# Patient Record
Sex: Female | Born: 1980 | Race: Black or African American | Hispanic: No | Marital: Married | Smoking: Former smoker
Health system: Southern US, Community
[De-identification: ages and names within clinical notes are randomized; demographics above are authoritative.]

## PROBLEM LIST (undated history)

## (undated) DIAGNOSIS — F419 Anxiety disorder, unspecified: Secondary | ICD-10-CM

## (undated) DIAGNOSIS — E559 Vitamin D deficiency, unspecified: Secondary | ICD-10-CM

## (undated) DIAGNOSIS — D649 Anemia, unspecified: Secondary | ICD-10-CM

## (undated) DIAGNOSIS — I10 Essential (primary) hypertension: Secondary | ICD-10-CM

## (undated) DIAGNOSIS — F32A Depression, unspecified: Secondary | ICD-10-CM

---
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
## 2003-01-29 ENCOUNTER — Emergency Department (HOSPITAL_COMMUNITY): Admission: AD | Admit: 2003-01-29 | Discharge: 2003-01-29 | Payer: Self-pay | Admitting: Family Medicine

## 2003-02-04 ENCOUNTER — Emergency Department (HOSPITAL_COMMUNITY): Admission: AD | Admit: 2003-02-04 | Discharge: 2003-02-04 | Payer: Self-pay | Admitting: Family Medicine

## 2003-04-20 ENCOUNTER — Emergency Department (HOSPITAL_COMMUNITY): Admission: AD | Admit: 2003-04-20 | Discharge: 2003-04-20 | Payer: Self-pay | Admitting: Family Medicine

## 2004-05-25 ENCOUNTER — Emergency Department (HOSPITAL_COMMUNITY): Admission: EM | Admit: 2004-05-25 | Discharge: 2004-05-26 | Payer: Self-pay | Admitting: Emergency Medicine

## 2005-06-02 ENCOUNTER — Emergency Department (HOSPITAL_COMMUNITY): Admission: EM | Admit: 2005-06-02 | Discharge: 2005-06-03 | Payer: Self-pay | Admitting: Emergency Medicine

## 2005-12-25 ENCOUNTER — Emergency Department (HOSPITAL_COMMUNITY): Admission: EM | Admit: 2005-12-25 | Discharge: 2005-12-25 | Payer: Self-pay | Admitting: Emergency Medicine

## 2006-11-09 ENCOUNTER — Emergency Department (HOSPITAL_COMMUNITY): Admission: EM | Admit: 2006-11-09 | Discharge: 2006-11-09 | Payer: Self-pay | Admitting: Emergency Medicine

## 2006-11-15 ENCOUNTER — Emergency Department (HOSPITAL_COMMUNITY): Admission: EM | Admit: 2006-11-15 | Discharge: 2006-11-15 | Payer: Self-pay | Admitting: Emergency Medicine

## 2007-02-22 ENCOUNTER — Emergency Department (HOSPITAL_COMMUNITY): Admission: EM | Admit: 2007-02-22 | Discharge: 2007-02-22 | Payer: Self-pay | Admitting: Emergency Medicine

## 2007-07-13 ENCOUNTER — Emergency Department (HOSPITAL_COMMUNITY): Admission: EM | Admit: 2007-07-13 | Discharge: 2007-07-13 | Payer: Self-pay | Admitting: Emergency Medicine

## 2007-09-15 ENCOUNTER — Emergency Department (HOSPITAL_COMMUNITY): Admission: EM | Admit: 2007-09-15 | Discharge: 2007-09-15 | Payer: Self-pay | Admitting: *Deleted

## 2007-09-29 ENCOUNTER — Emergency Department (HOSPITAL_COMMUNITY): Admission: EM | Admit: 2007-09-29 | Discharge: 2007-09-29 | Payer: Self-pay | Admitting: Emergency Medicine

## 2007-10-04 ENCOUNTER — Inpatient Hospital Stay (HOSPITAL_COMMUNITY): Admission: AD | Admit: 2007-10-04 | Discharge: 2007-10-04 | Payer: Self-pay | Admitting: Family Medicine

## 2007-12-20 ENCOUNTER — Inpatient Hospital Stay (HOSPITAL_COMMUNITY): Admission: AD | Admit: 2007-12-20 | Discharge: 2007-12-20 | Payer: Self-pay | Admitting: Obstetrics & Gynecology

## 2008-09-07 ENCOUNTER — Inpatient Hospital Stay (HOSPITAL_COMMUNITY): Admission: AD | Admit: 2008-09-07 | Discharge: 2008-09-07 | Payer: Self-pay | Admitting: Obstetrics and Gynecology

## 2008-10-07 ENCOUNTER — Ambulatory Visit: Payer: Self-pay | Admitting: Certified Nurse Midwife

## 2008-11-18 ENCOUNTER — Ambulatory Visit: Payer: Self-pay | Admitting: Family Medicine

## 2008-11-18 ENCOUNTER — Inpatient Hospital Stay (HOSPITAL_COMMUNITY): Admission: AD | Admit: 2008-11-18 | Discharge: 2008-11-18 | Payer: Self-pay | Admitting: Obstetrics and Gynecology

## 2008-12-03 ENCOUNTER — Encounter: Payer: Self-pay | Admitting: Certified Nurse Midwife

## 2008-12-05 ENCOUNTER — Emergency Department (HOSPITAL_COMMUNITY): Admission: EM | Admit: 2008-12-05 | Discharge: 2008-12-05 | Payer: Self-pay | Admitting: Emergency Medicine

## 2008-12-05 ENCOUNTER — Inpatient Hospital Stay (HOSPITAL_COMMUNITY): Admission: AD | Admit: 2008-12-05 | Discharge: 2008-12-06 | Payer: Self-pay | Admitting: Psychiatry

## 2008-12-05 ENCOUNTER — Ambulatory Visit: Payer: Self-pay | Admitting: Psychiatry

## 2008-12-16 ENCOUNTER — Encounter: Payer: Self-pay | Admitting: Certified Nurse Midwife

## 2009-01-15 ENCOUNTER — Encounter: Payer: Self-pay | Admitting: Certified Nurse Midwife

## 2009-01-16 ENCOUNTER — Observation Stay: Payer: Self-pay

## 2009-01-17 ENCOUNTER — Encounter: Payer: Self-pay | Admitting: Certified Nurse Midwife

## 2009-02-05 ENCOUNTER — Inpatient Hospital Stay (HOSPITAL_COMMUNITY): Admission: AD | Admit: 2009-02-05 | Discharge: 2009-02-05 | Payer: Self-pay | Admitting: Obstetrics & Gynecology

## 2009-02-05 ENCOUNTER — Ambulatory Visit: Payer: Self-pay | Admitting: Family

## 2009-02-15 ENCOUNTER — Encounter: Payer: Self-pay | Admitting: Certified Nurse Midwife

## 2009-03-02 ENCOUNTER — Inpatient Hospital Stay (HOSPITAL_COMMUNITY): Admission: AD | Admit: 2009-03-02 | Discharge: 2009-03-02 | Payer: Self-pay | Admitting: Obstetrics & Gynecology

## 2009-03-04 ENCOUNTER — Inpatient Hospital Stay: Payer: Self-pay

## 2009-06-26 ENCOUNTER — Emergency Department (HOSPITAL_COMMUNITY): Admission: EM | Admit: 2009-06-26 | Discharge: 2009-06-26 | Payer: Self-pay | Admitting: Emergency Medicine

## 2009-08-14 IMAGING — CR DG FOOT COMPLETE 3+V*R*
3 series · 3 of 3 positions shown · non-contrast
Comparison: No prior

CLINICAL DATA: Fell down steps - pain across metatarsals

RIGHT FOOT COMPLETE - 3+ VIEW

[t foot ap right]
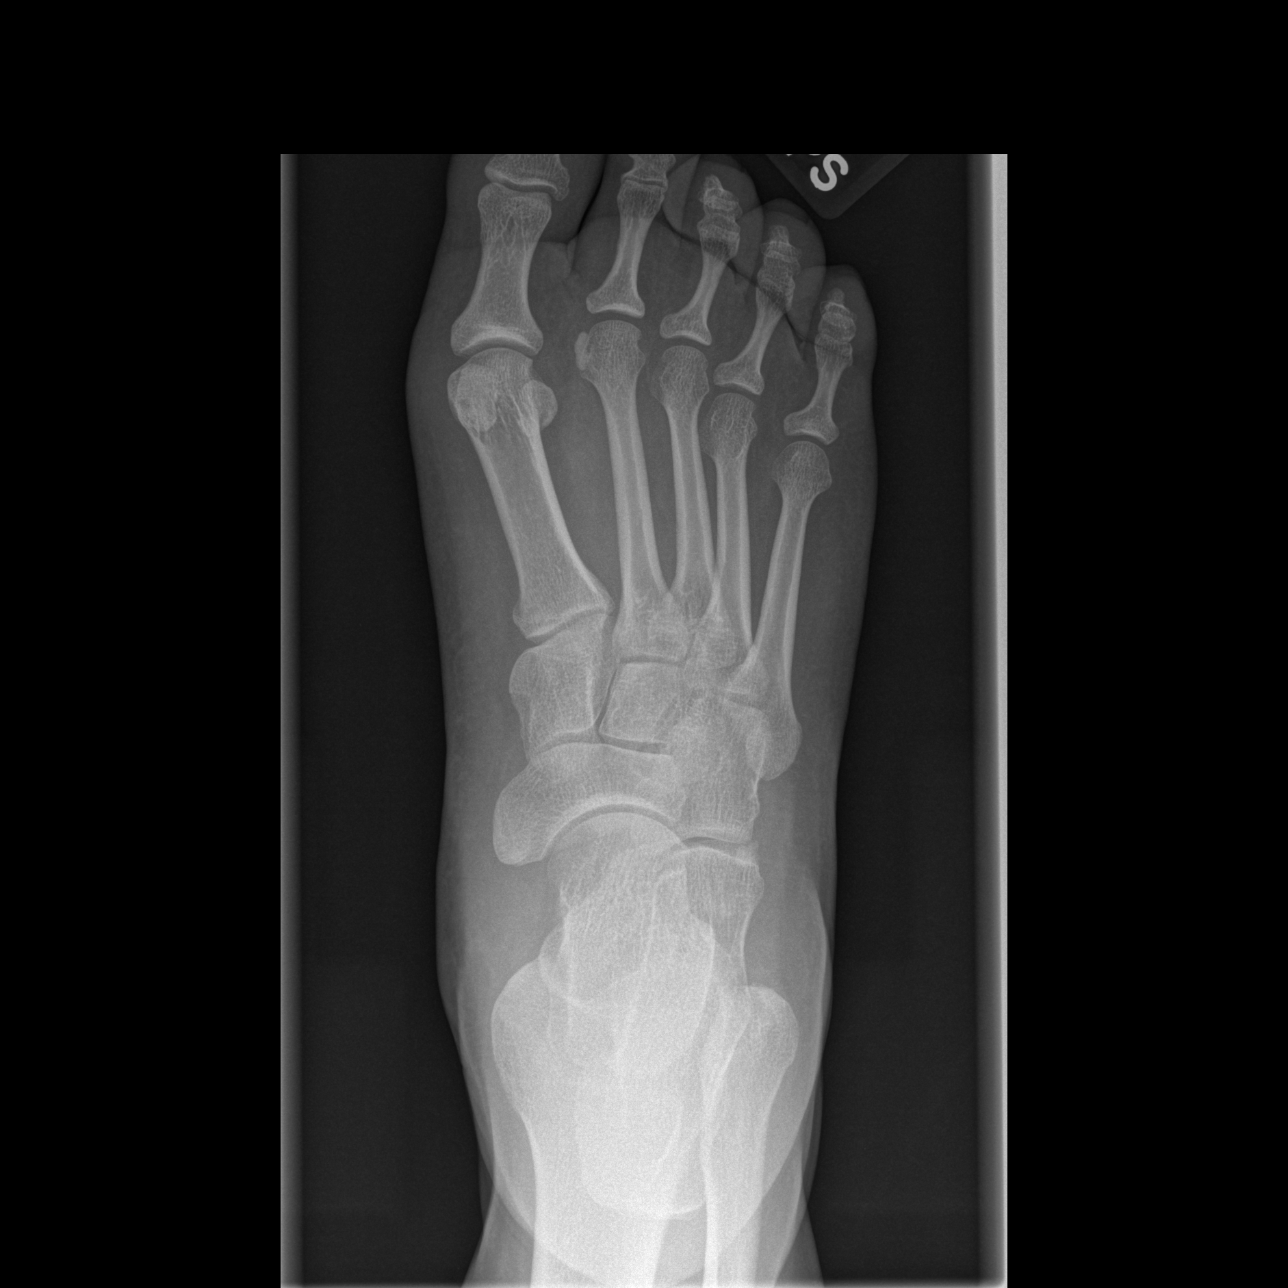

[t foot oblique right]
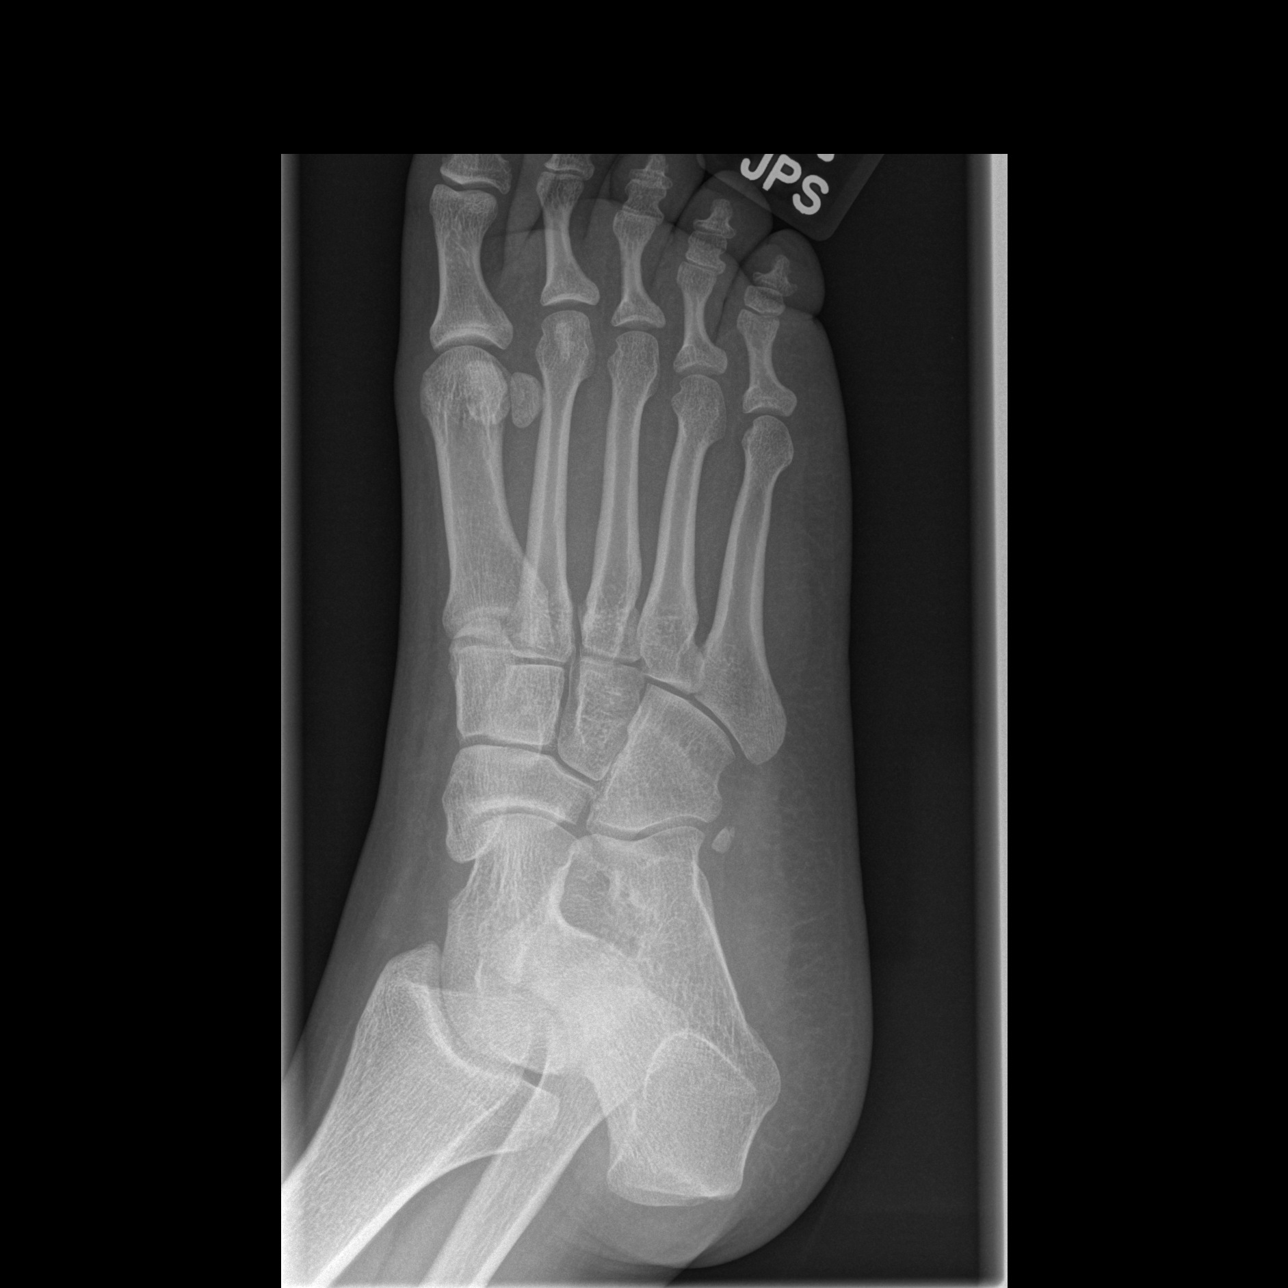

[t foot lat right]
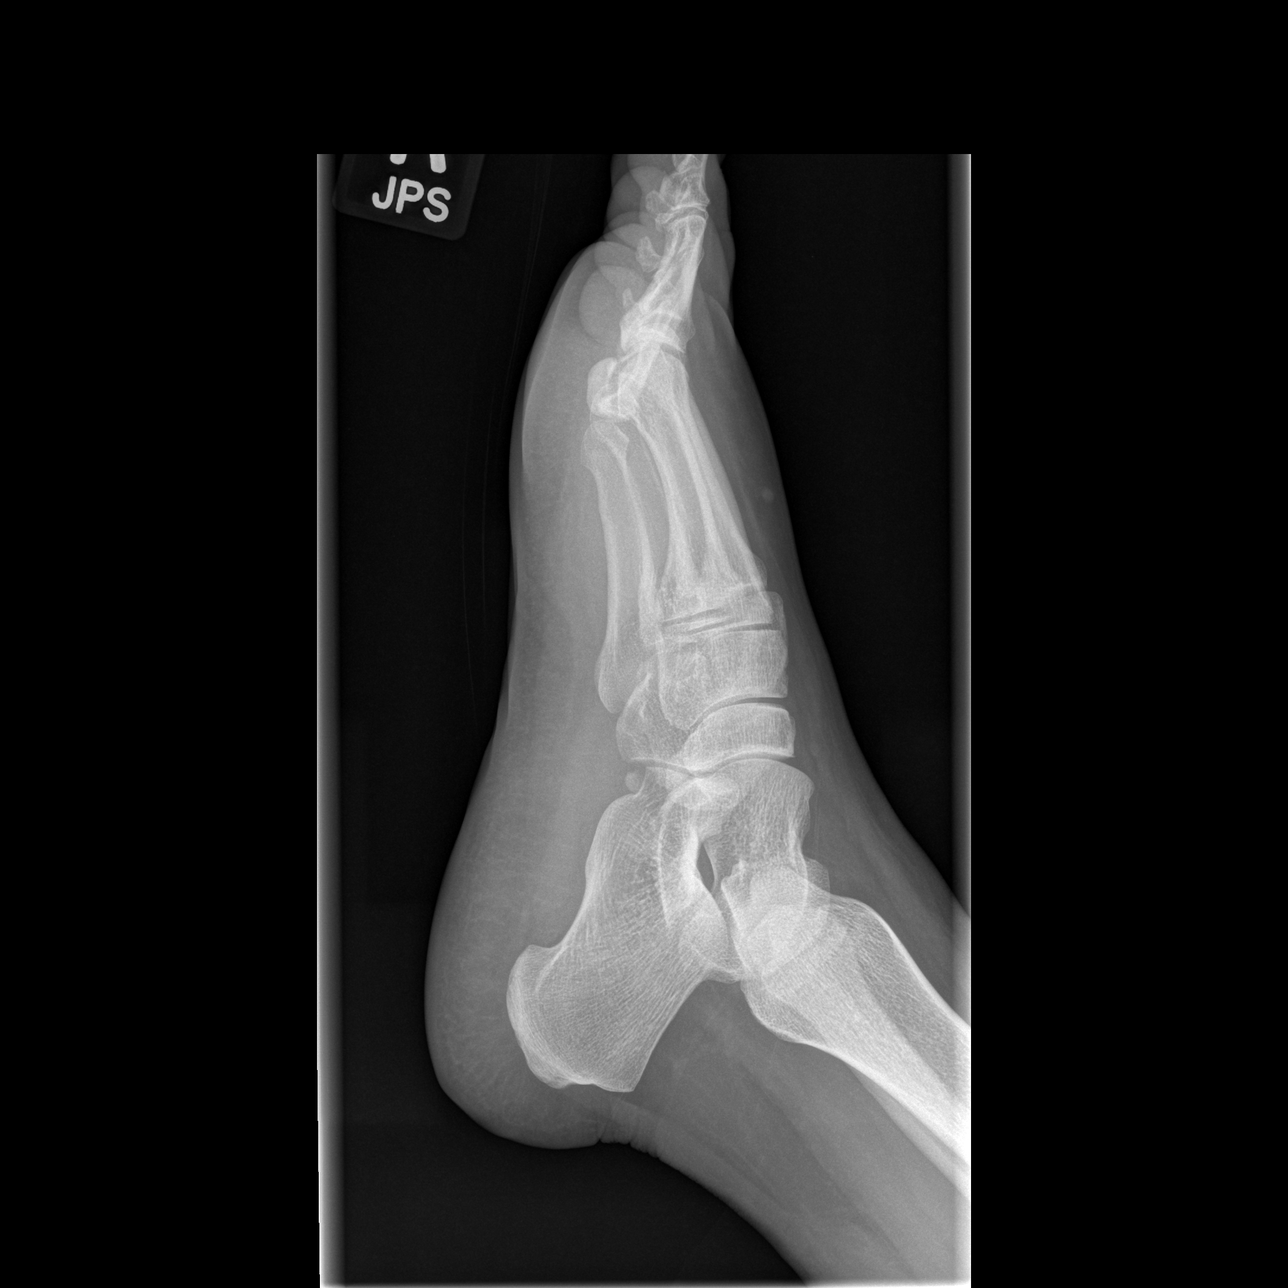

[3 of 3 positions shown; findings below may reference images not displayed]

FINDINGS: .  There is soft tissue swelling over the dorsum of the
foot.  There is no fracture or dislocation.
IMPRESSION: Soft tissue swelling over the dorsum of the foot - otherwise
unremarkable.  Specifically no metatarsal fractures.

## 2009-10-31 IMAGING — US US OB TRANSVAGINAL MODIFY
1 series · 14 of 28 positions shown · non-contrast
Comparison: None

CLINICAL DATA: 8 weeks pregnant, vaginal bleeding

OBSTETRIC <14 WK US AND TRANSVAGINAL OB US
TECHNIQUE: Both transabdominal and transvaginal ultrasound
examinations were performed for complete evaluation of the
gestation as well as the maternal uterus, adnexal regions, and
pelvic cul-de-sac.

[Series 1: unknown · 0.28mm/px · 14 of 42 slices shown]
[im 2/42]
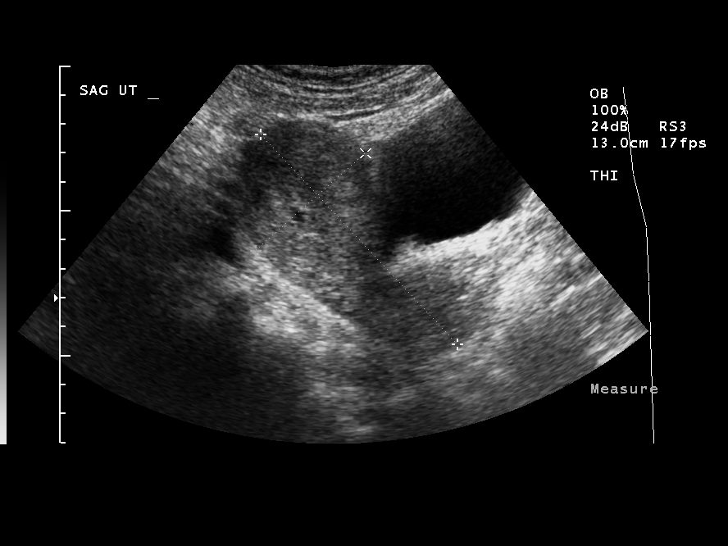
[im 5/42]
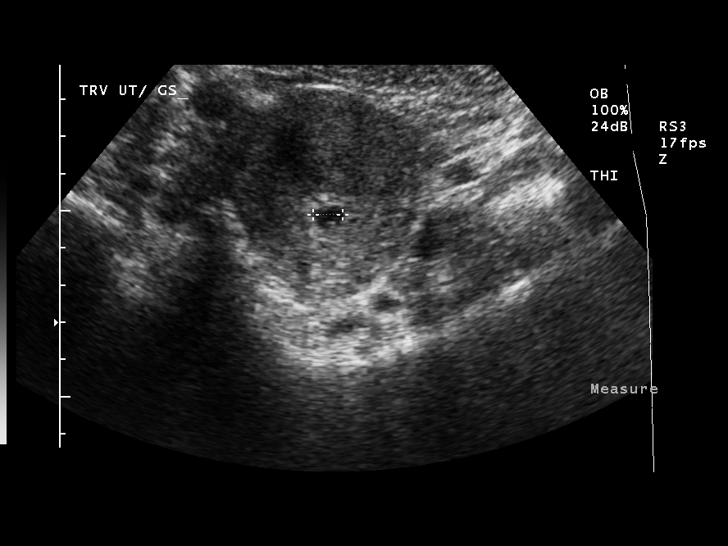
[im 8/42]
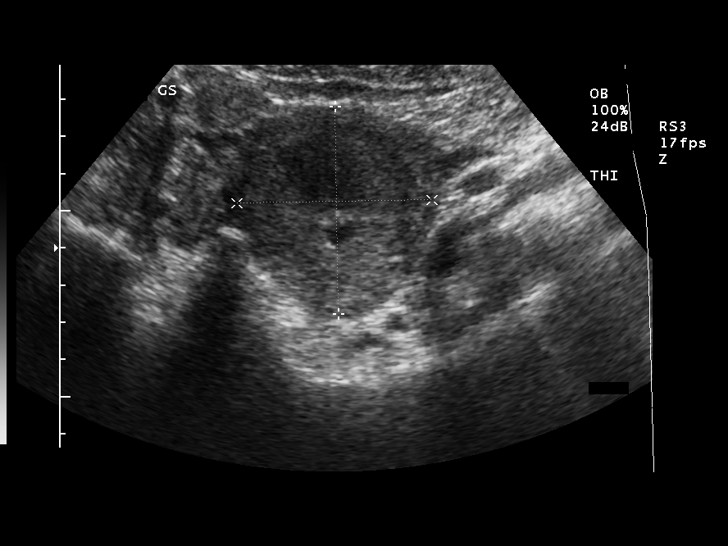
[im 11/42]
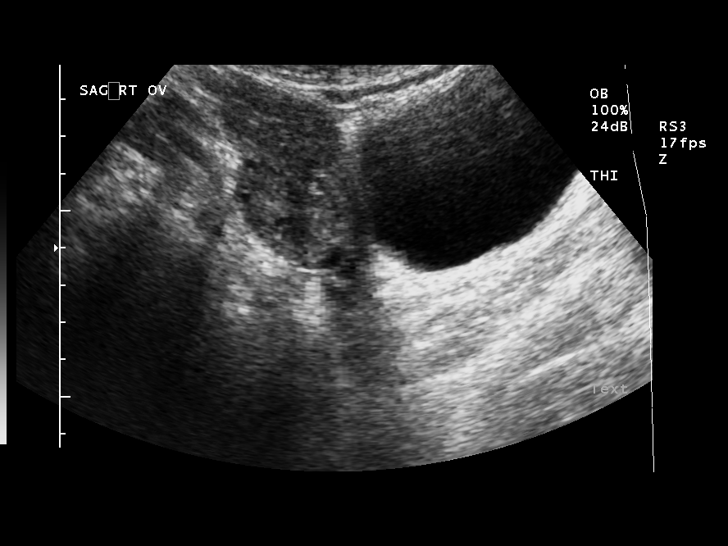
[im 14/42]
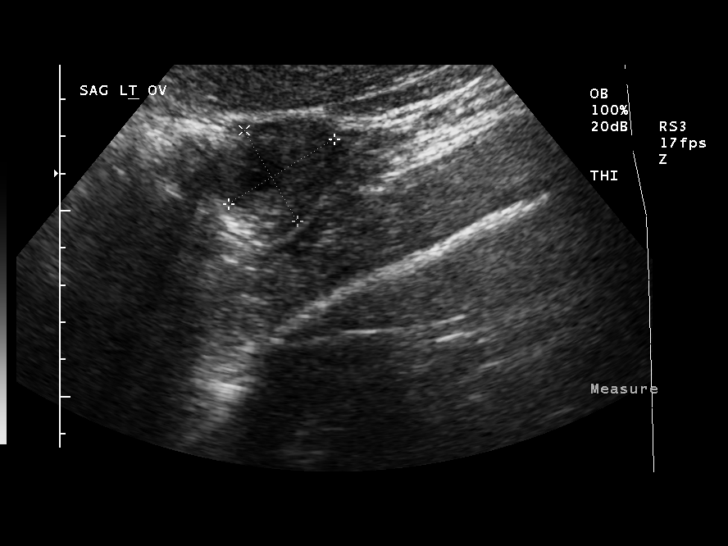
[im 17/42]
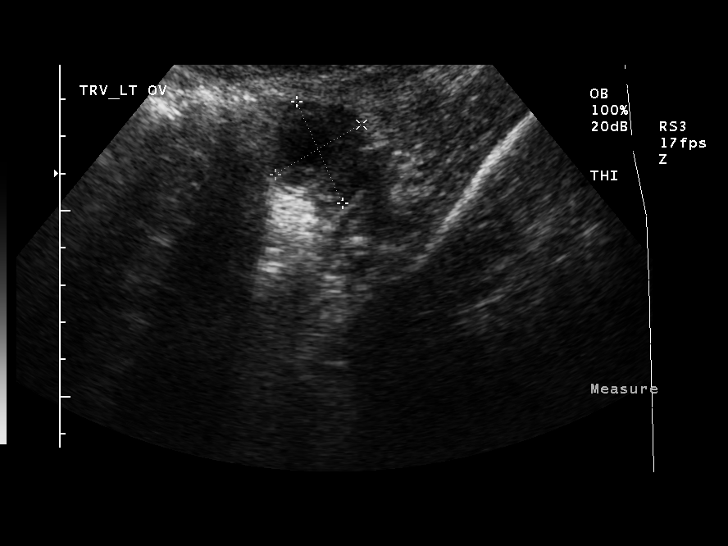
[im 20/42]
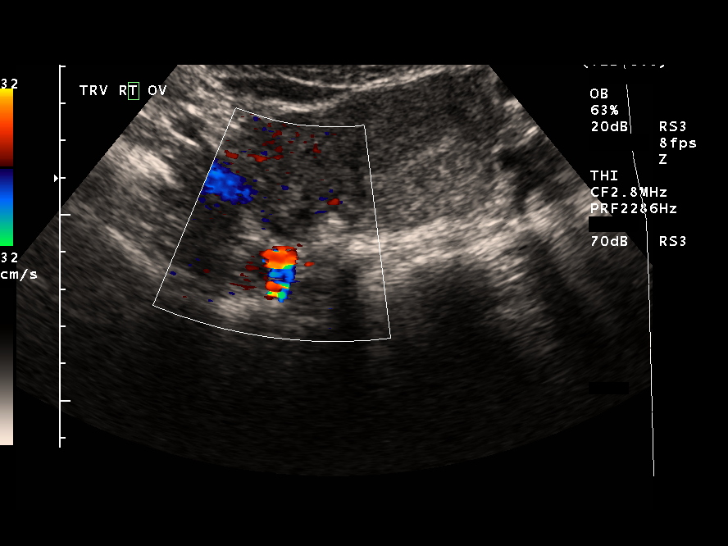
[im 23/42]
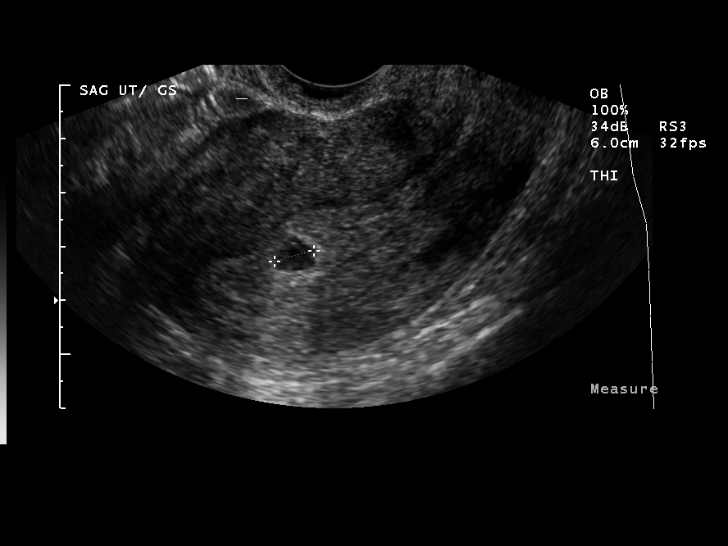
[im 26/42]
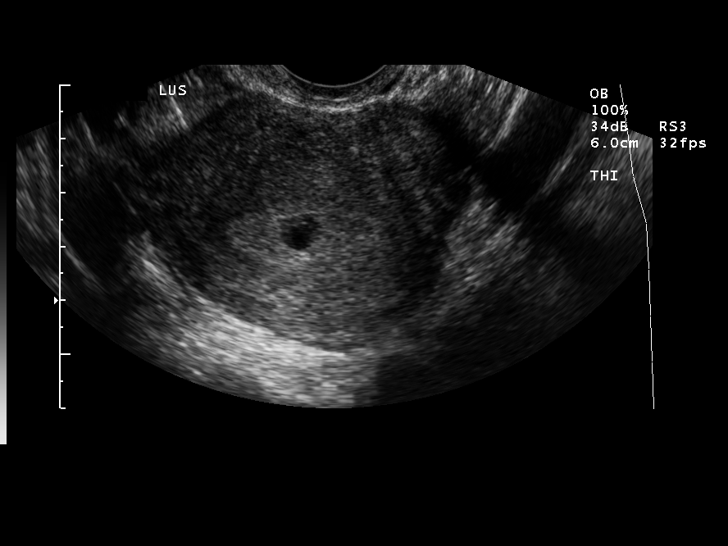
[im 29/42]
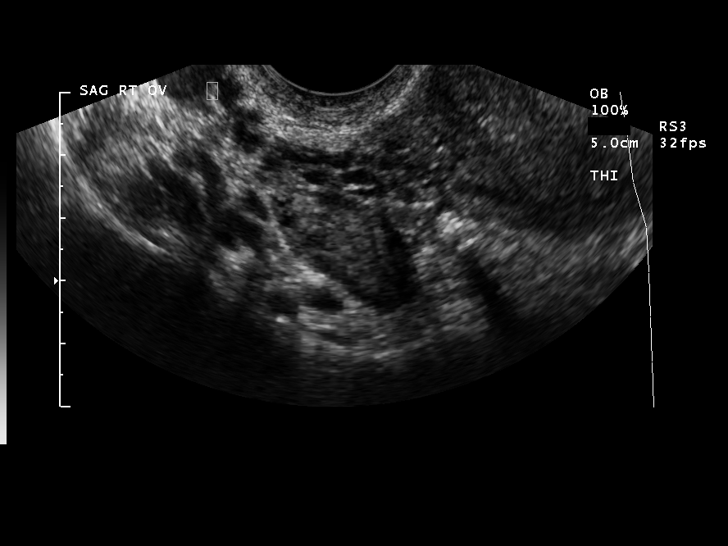
[im 32/42]
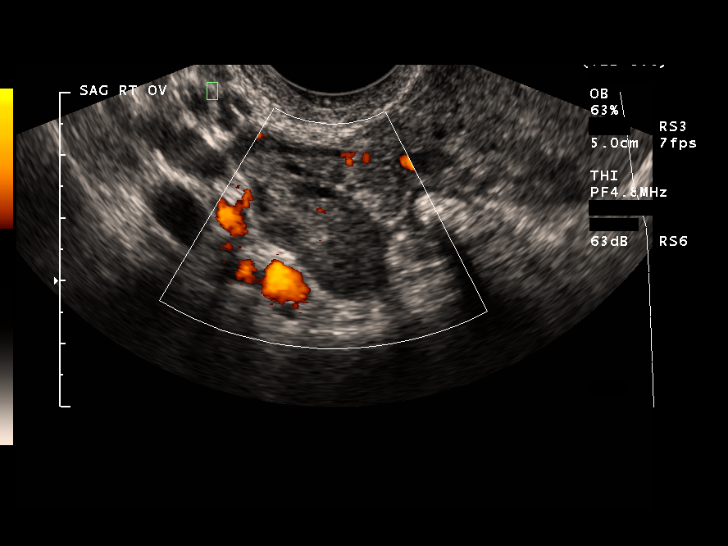
[im 35/42]
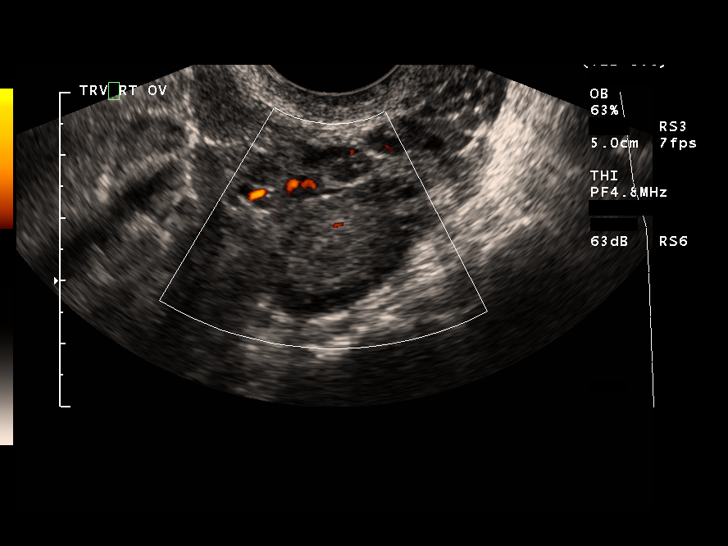
[im 38/42]
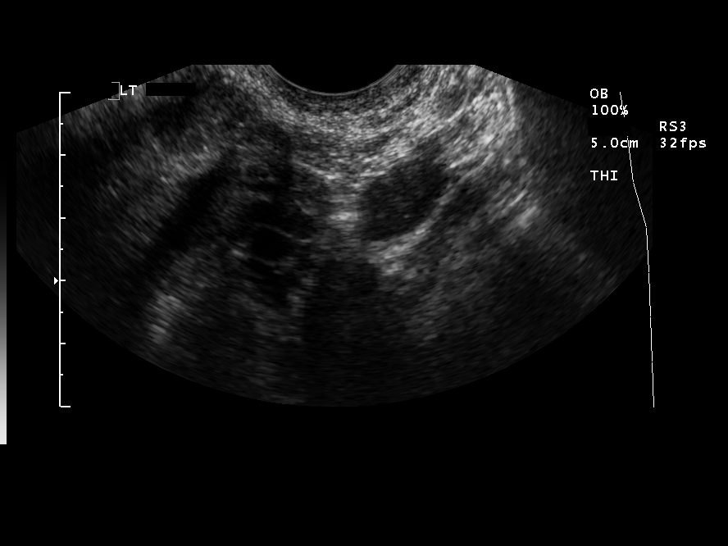
[im 42/42]
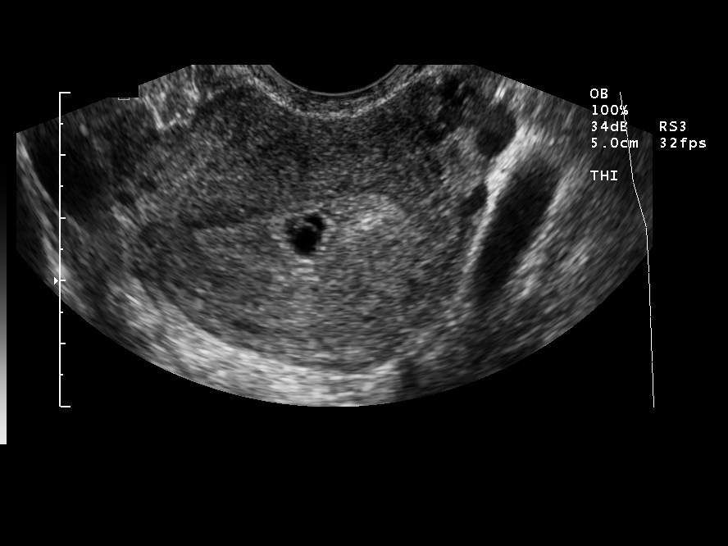

[14 of 28 positions shown; findings below may reference images not displayed]

Intrauterine gestational sac: Present
Yolk sac: Not visualized
Embryo: Not visualized
Cardiac Activity: Not visualized

MSD: 7  mm  5    w 3    d
US EDC: 05/28/2008

Maternal uterus/adnexae:
Ovaries are unremarkable.  1.5 cm thick-walled left ovarian cyst
may represent a corpus luteum.  No free fluid.
IMPRESSION: Intrauterine gestational sac without fetal pole, yolk sac, or
cardiac activity identified.  Follow-up ultrasound in 10 days is
suggested to help determine viability.

## 2009-11-05 IMAGING — US US OB COMP LESS 14 WK
1 series · 14 of 28 positions shown · non-contrast
Comparison: none

OBSTETRICAL ULTRASOUND:
 This ultrasound exam was performed in the [HOSPITAL] Ultrasound Department.  The OB US report was generated in the AS system, and faxed to the ordering physician.  This report is also available in [REDACTED] PACS.

[Series 1: us ob comp less 14 wks · 14 of 59 slices shown]
[im 3/59]
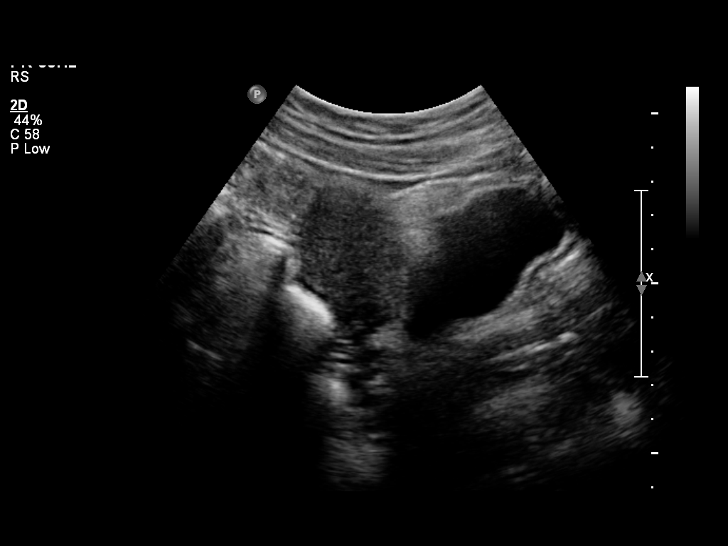
[im 7/59]
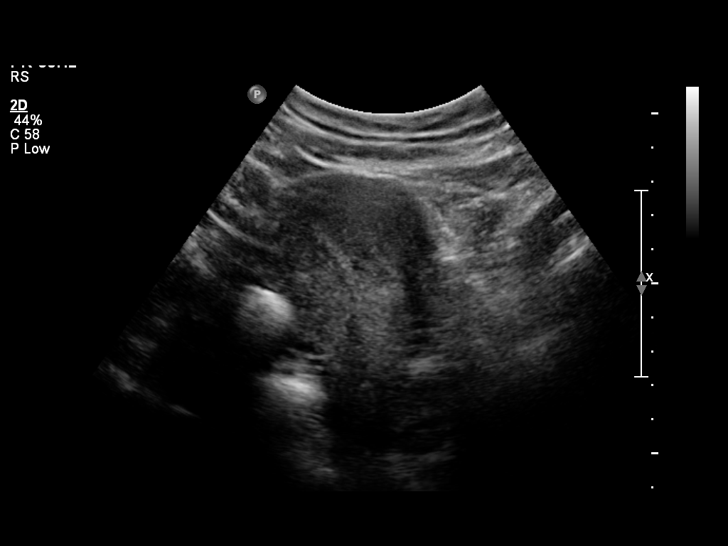
[im 11/59]
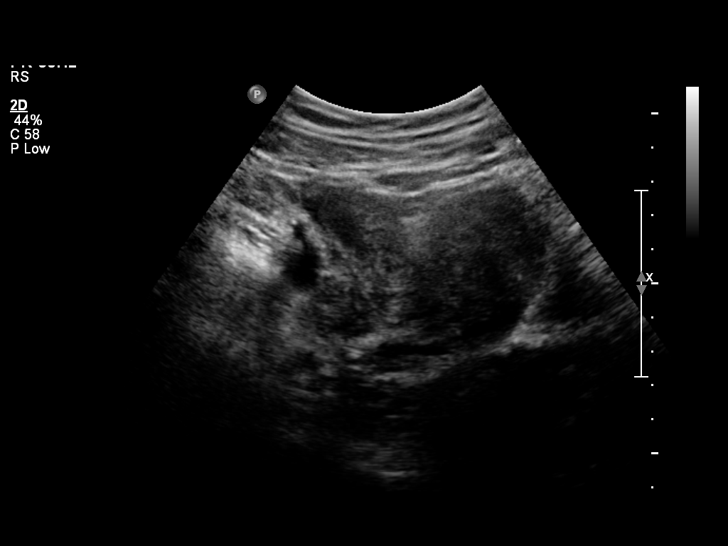
[im 16/59]
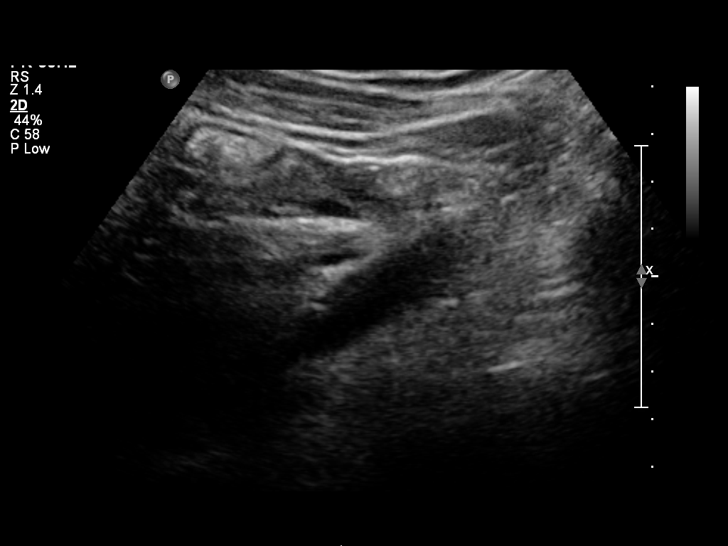
[im 20/59]
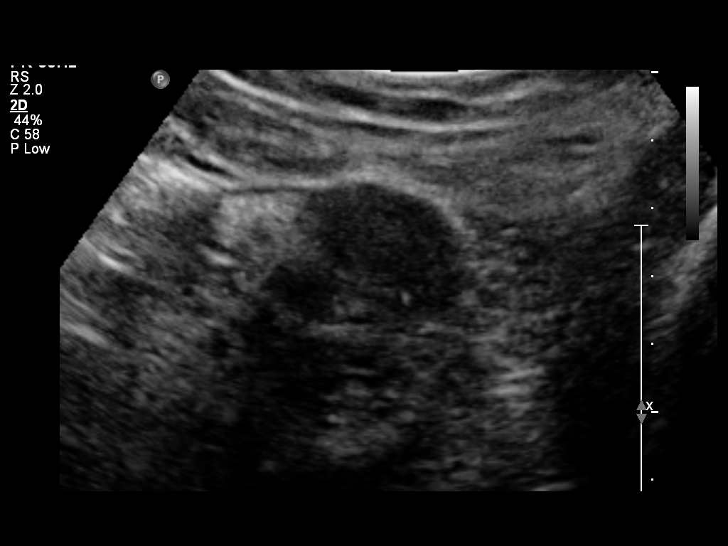
[im 24/59]
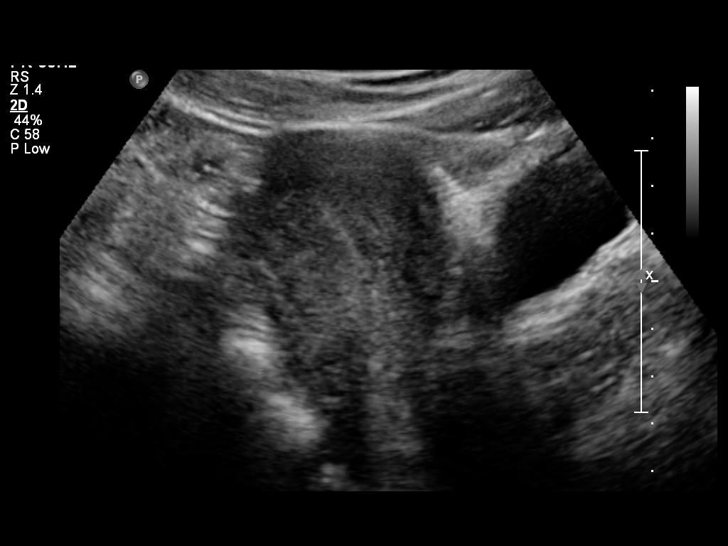
[im 28/59]
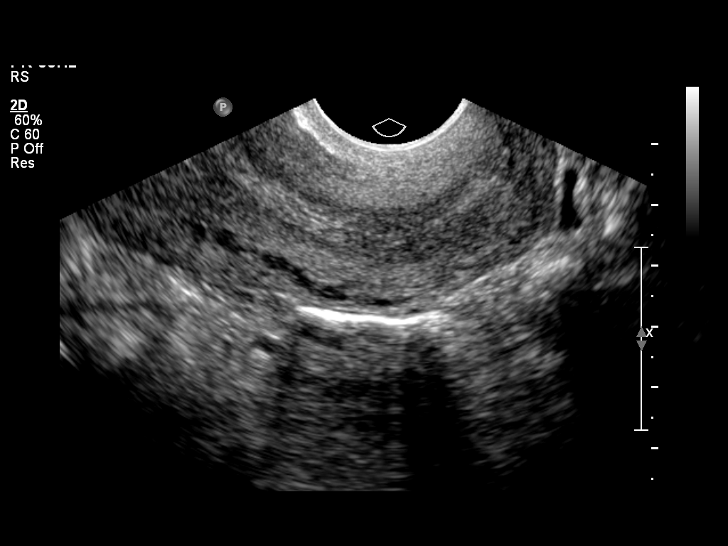
[im 33/59]
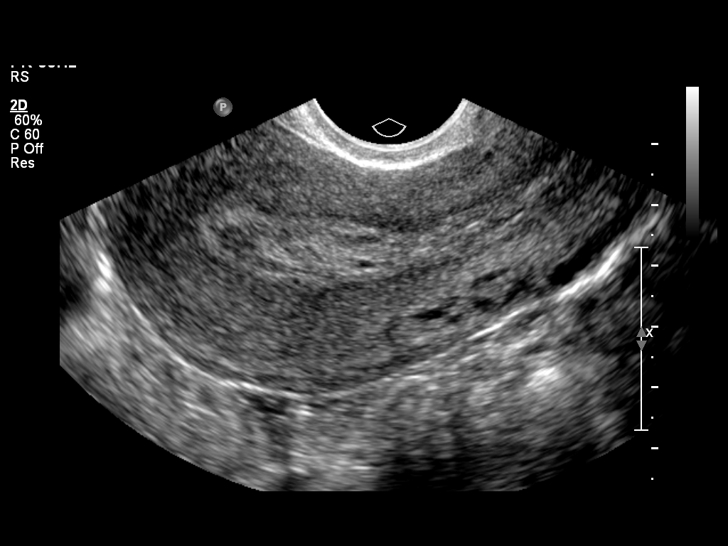
[im 37/59]
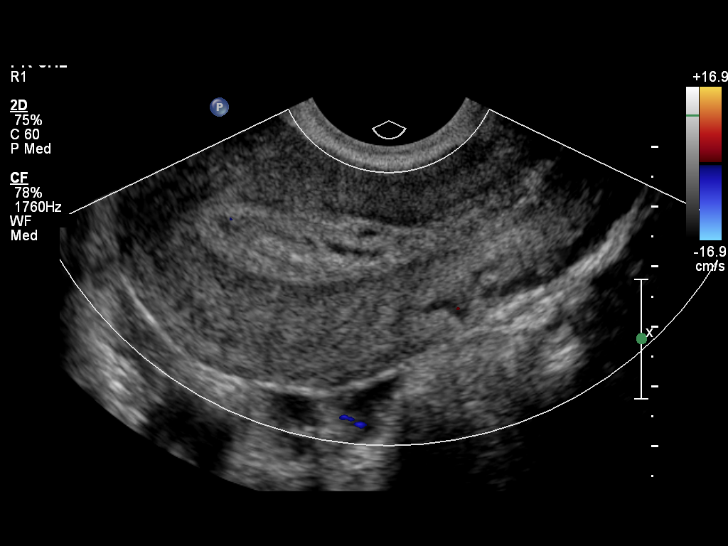
[im 41/59]
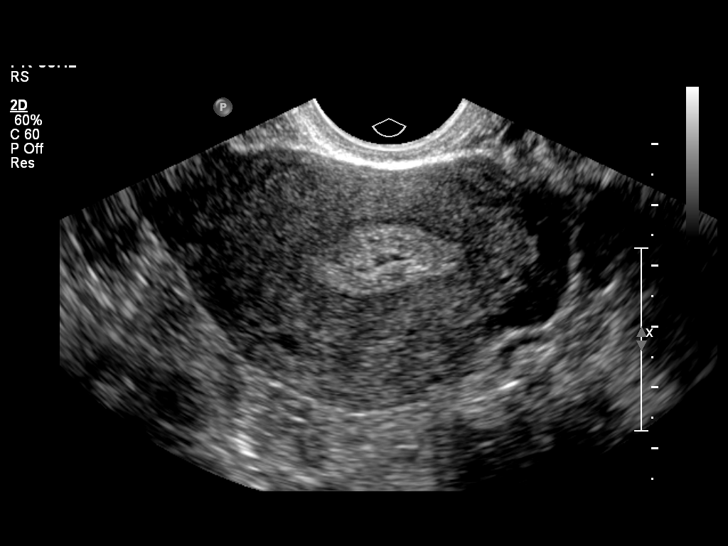
[im 46/59]
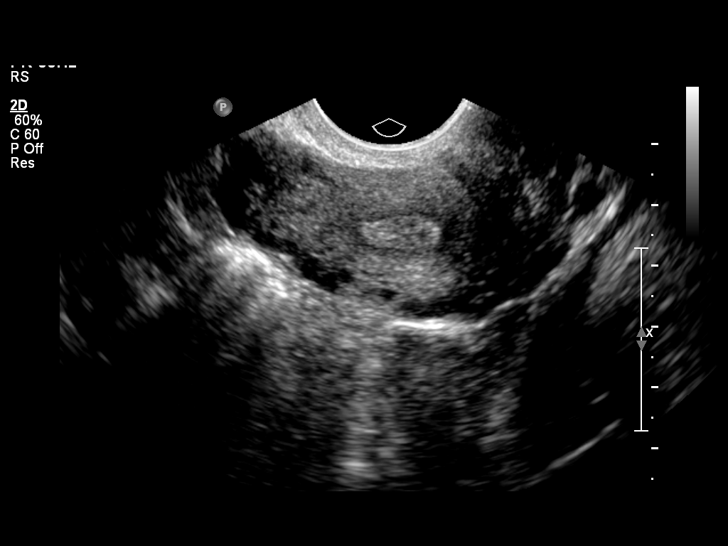
[im 50/59]
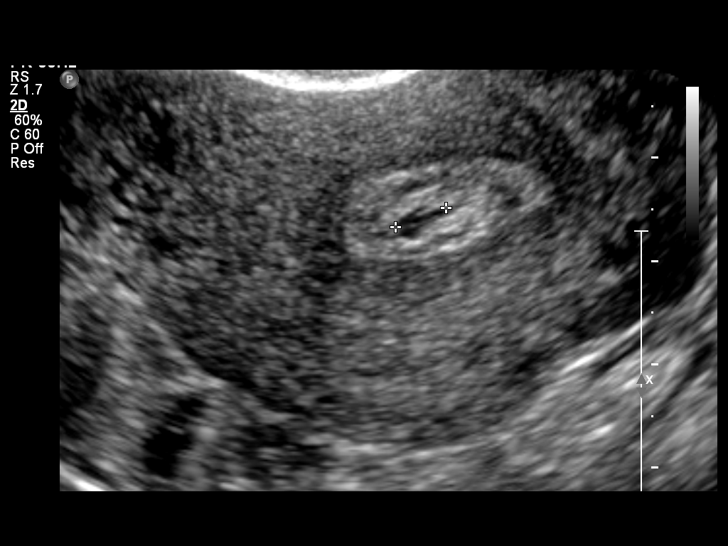
[im 54/59]
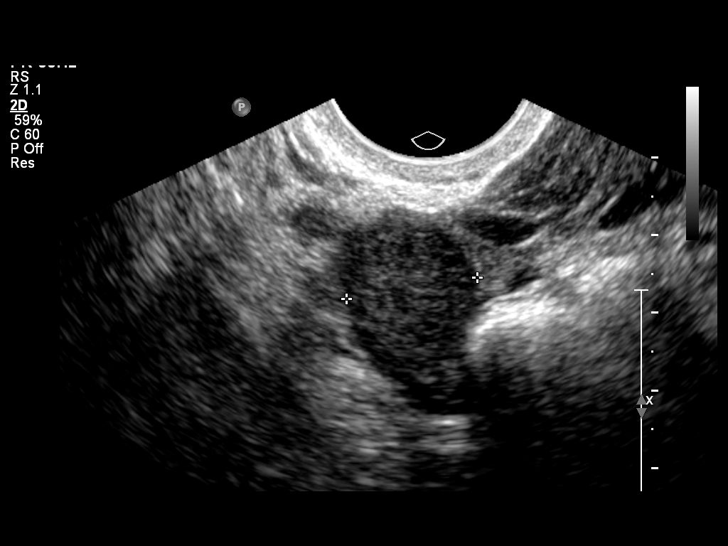
[im 59/59]
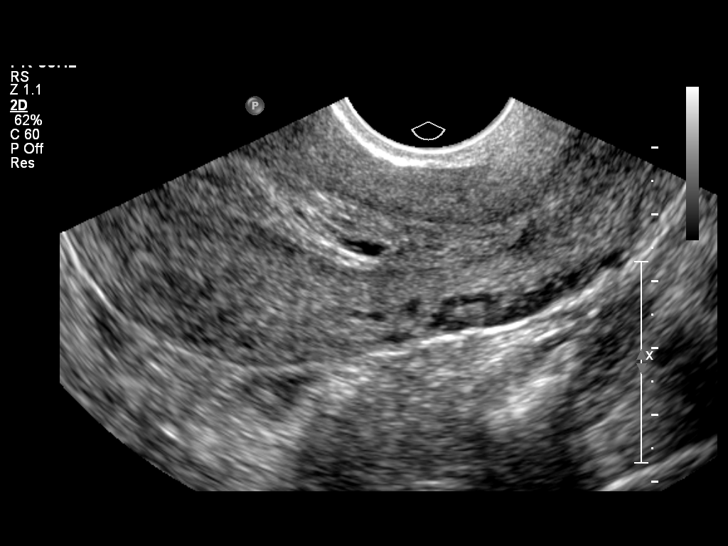

[14 of 28 positions shown; findings below may reference images not displayed]

IMPRESSION: See [facility] Obstetric US report.

## 2010-05-05 LAB — URINALYSIS, MICROSCOPIC ONLY
Ketones, ur: 15 mg/dL — AB
Nitrite: NEGATIVE
Specific Gravity, Urine: 1.035 — ABNORMAL HIGH (ref 1.005–1.030)
Urobilinogen, UA: 1 mg/dL (ref 0.0–1.0)

## 2010-05-05 LAB — POCT PREGNANCY, URINE: Preg Test, Ur: NEGATIVE

## 2010-05-05 LAB — POCT URINALYSIS DIP (DEVICE)
Glucose, UA: NEGATIVE mg/dL
Protein, ur: 30 mg/dL — AB
Specific Gravity, Urine: 1.025 (ref 1.005–1.030)
Urobilinogen, UA: 1 mg/dL (ref 0.0–1.0)

## 2010-05-05 LAB — POCT I-STAT, CHEM 8
Calcium, Ion: 1.18 mmol/L (ref 1.12–1.32)
Glucose, Bld: 99 mg/dL (ref 70–99)
HCT: 42 % (ref 36.0–46.0)
Hemoglobin: 14.3 g/dL (ref 12.0–15.0)
TCO2: 24 mmol/L (ref 0–100)

## 2010-05-05 LAB — URINE CULTURE: Colony Count: 50000

## 2010-05-05 LAB — GC/CHLAMYDIA PROBE AMP, GENITAL
Chlamydia, DNA Probe: NEGATIVE
GC Probe Amp, Genital: NEGATIVE

## 2010-05-21 LAB — URINALYSIS, ROUTINE W REFLEX MICROSCOPIC
Bilirubin Urine: NEGATIVE
Glucose, UA: NEGATIVE mg/dL
Hgb urine dipstick: NEGATIVE
Ketones, ur: 15 mg/dL — AB
Ketones, ur: NEGATIVE mg/dL
Nitrite: NEGATIVE
Protein, ur: NEGATIVE mg/dL
Protein, ur: NEGATIVE mg/dL
Urobilinogen, UA: 1 mg/dL (ref 0.0–1.0)

## 2010-05-21 LAB — RAPID URINE DRUG SCREEN, HOSP PERFORMED
Amphetamines: NOT DETECTED
Benzodiazepines: NOT DETECTED
Cocaine: NOT DETECTED

## 2010-05-21 LAB — POCT I-STAT, CHEM 8
BUN: 6 mg/dL (ref 6–23)
Calcium, Ion: 1.16 mmol/L (ref 1.12–1.32)
TCO2: 19 mmol/L (ref 0–100)

## 2010-05-21 LAB — URINE MICROSCOPIC-ADD ON

## 2010-05-21 LAB — ETHANOL: Alcohol, Ethyl (B): <5 mg/dL (ref 0–10)

## 2010-05-24 LAB — CBC
HCT: 38.5 % (ref 36.0–46.0)
Hemoglobin: 13.3 g/dL (ref 12.0–15.0)
MCHC: 34.5 g/dL (ref 30.0–36.0)
RDW: 13.9 % (ref 11.5–15.5)

## 2010-05-24 LAB — URINE MICROSCOPIC-ADD ON

## 2010-05-24 LAB — URINALYSIS, ROUTINE W REFLEX MICROSCOPIC
Glucose, UA: NEGATIVE mg/dL
Specific Gravity, Urine: 1.025 (ref 1.005–1.030)

## 2010-11-13 LAB — PREGNANCY, URINE: Preg Test, Ur: POSITIVE

## 2010-11-13 LAB — WET PREP, GENITAL
Trich, Wet Prep: NONE SEEN
Yeast Wet Prep HPF POC: NONE SEEN

## 2010-11-13 LAB — CBC
HCT: 38.5
Hemoglobin: 12.9
MCHC: 33.4
RDW: 13.8

## 2010-11-13 LAB — POCT I-STAT, CHEM 8
BUN: 8
Creatinine, Ser: 1.1
Glucose, Bld: 72
Hemoglobin: 13.6
Potassium: 3.6

## 2010-11-13 LAB — URINE MICROSCOPIC-ADD ON

## 2010-11-13 LAB — URINALYSIS, ROUTINE W REFLEX MICROSCOPIC
Bilirubin Urine: NEGATIVE
Protein, ur: 30 — AB
Urobilinogen, UA: 1

## 2010-11-13 LAB — DIFFERENTIAL
Basophils Absolute: 0
Eosinophils Relative: 4
Lymphocytes Relative: 33
Monocytes Absolute: 0.5
Monocytes Relative: 7

## 2010-11-17 LAB — CBC
Hemoglobin: 12.9
MCHC: 33.1
MCV: 93.3
RBC: 4.16
WBC: 7.1

## 2010-11-17 LAB — URINALYSIS, ROUTINE W REFLEX MICROSCOPIC
Bilirubin Urine: NEGATIVE
Ketones, ur: NEGATIVE
Leukocytes, UA: NEGATIVE
Nitrite: NEGATIVE
Protein, ur: NEGATIVE
Urobilinogen, UA: 0.2
pH: 6

## 2010-11-17 LAB — WET PREP, GENITAL
Trich, Wet Prep: NONE SEEN
Yeast Wet Prep HPF POC: NONE SEEN

## 2010-11-17 LAB — GC/CHLAMYDIA PROBE AMP, GENITAL: Chlamydia, DNA Probe: NEGATIVE

## 2010-11-26 LAB — POCT PREGNANCY, URINE: Preg Test, Ur: NEGATIVE

## 2010-11-26 LAB — POCT URINALYSIS DIP (DEVICE)
Operator id: 235561
Protein, ur: NEGATIVE
Specific Gravity, Urine: 1.025
Urobilinogen, UA: 0.2
pH: 6.5

## 2010-11-26 LAB — URINE CULTURE: Colony Count: 50000

## 2020-05-30 ENCOUNTER — Encounter: Payer: Self-pay | Admitting: Emergency Medicine

## 2020-05-30 ENCOUNTER — Emergency Department (INDEPENDENT_AMBULATORY_CARE_PROVIDER_SITE_OTHER)
Admission: EM | Admit: 2020-05-30 | Discharge: 2020-05-30 | Disposition: A | Discharge status: Home / Self Care | Payer: No Typology Code available for payment source | Source: Home / Self Care | Attending: Emergency Medicine | Admitting: Emergency Medicine

## 2020-05-30 DIAGNOSIS — R519 Headache, unspecified: Secondary | ICD-10-CM | POA: Diagnosis not present

## 2020-05-30 MED ORDER — ACETAMINOPHEN 500 MG PO TABS
1000.0000 mg | ORAL_TABLET | Freq: Once | ORAL | Status: AC
  Administered 2020-05-30: 1000 mg via ORAL

## 2020-05-30 MED ORDER — IBUPROFEN 600 MG PO TABS: 600.0000 mg | ORAL_TABLET | Freq: Four times a day (QID) | ORAL | 0 refills | Status: DC | PRN

## 2020-05-30 MED ORDER — ONDANSETRON 8 MG PO TBDP
8.0000 mg | ORAL_TABLET | Freq: Once | ORAL | Status: AC
  Administered 2020-05-30: 8 mg via ORAL

## 2020-05-30 MED ORDER — ONDANSETRON 8 MG PO TBDP: ORAL_TABLET | ORAL | 0 refills | Status: DC

## 2020-05-30 MED ORDER — DEXAMETHASONE SODIUM PHOSPHATE 10 MG/ML IJ SOLN
10.0000 mg | Freq: Once | INTRAMUSCULAR | Status: AC
  Administered 2020-05-30: 10 mg via INTRAMUSCULAR

## 2020-05-30 MED ORDER — KETOROLAC TROMETHAMINE 60 MG/2ML IM SOLN
30.0000 mg | Freq: Once | INTRAMUSCULAR | Status: AC
  Administered 2020-05-30: 30 mg via INTRAMUSCULAR

## 2020-05-30 NOTE — ED Provider Notes (Signed)
HPI  SUBJECTIVE:  Sheila Carr is a 40 y.o. female who reports a gradual onset, constant, daily headache located on the right side of her head.  This started 10 days ago.  She describes it as dull, achy.  She reports decreased appetite, nausea, vomiting, photophobia, phonophobia.  She has tried Excedrin with improvement in her symptoms.  Symptoms are worse with lights, noise, and "thinking".  No fevers,  visual changes, purulent nasal d/c, nasal congestion, sinus pain/pressure, ear pain, jaw pain, dental pain,  dysarthria, focal weakness, facial droop, discoordination. No body aches, neck stiffness, rash. No mental status changes, seizures, syncope. Denies eye strain.  No sudden onset, did not occur during exertion. Past medical history negative for migraines, temporal arteritis, glaucoma, seizures, stroke, aneurysm, atrial fibrillation, diabetes, hypertension, sickle cell disease.  Pt is not on any other antiplatelets/anticoagulants. FH neg stroke.  LMP: She is status post ablation.  Denies possibility of being pregnant.  PMD: [facility]    History reviewed. No pertinent past medical history.  History reviewed. No pertinent surgical history.  History reviewed. No pertinent family history.  Social History   Tobacco Use  . Smoking status: Current Every Day Smoker    Types: Cigarettes  . Smokeless tobacco: Never Used    No current facility-administered medications for this encounter.  Current Outpatient Medications:  .  Cyanocobalamin (VITAMIN B-12 IJ), Inject as directed., Disp: , Rfl:  .  ibuprofen (ADVIL) 600 MG tablet, Take 1 tablet (600 mg total) by mouth every 6 (six) hours as needed., Disp: 30 tablet, Rfl: 0 .  ondansetron (ZOFRAN ODT) 8 MG disintegrating tablet, 1/2- 1 tablet q 8 hr prn nausea, vomiting, Disp: 20 tablet, Rfl: 0 .  sertraline (ZOLOFT) 50 MG tablet, Take 1.5 tablets by mouth daily., Disp: , Rfl:  .  VITAMIN D PO, Take by mouth., Disp: , Rfl:    Allergies  Allergen Reactions  . Iodine Swelling  . Septra [Sulfamethoxazole-Trimethoprim] Hives     ROS  As noted in HPI.   Physical Exam  BP 106/72 (BP Location: Left Arm)   Pulse 78   Temp 98.3 F (36.8 C) (Oral)   SpO2 98%   Constitutional: Well developed, well nourished, sitting in a dark room Eyes: PERRL, EOMI, conjunctiva normal bilaterally.  Photophobic.  Unable to tolerate funduscopic exam.   HENT: Normocephalic, atraumatic,mucus membranes moist, normal nontender dentition.  TM normal b/l. No TMJ tenderness. No nasal congestion, no sinus tenderness. No temporal artery tenderness.  Neck: no cervical LN + bilateral trapezial muscle tenderness. No meningismus.  No cervical lymphadenopathy Respiratory: normal inspiratory effort Cardiovascular: Normal rate, regular rhythm GI:  nondistended skin: No rash, skin intact Musculoskeletal: No edema, no tenderness, no deformities Neurologic: Alert & oriented x 3, CN III-XII intact, romberg neg, finger-> nose, heel-> shin equal b/l, Romberg neg, tandem gait steady Psychiatric: Speech and behavior appropriate   ED Course  Medications  ketorolac (TORADOL) injection 30 mg (30 mg Intramuscular Given 05/30/20 1251)  ondansetron (ZOFRAN-ODT) disintegrating tablet 8 mg (8 mg Oral Given 05/30/20 1242)  acetaminophen (TYLENOL) tablet 1,000 mg (1,000 mg Oral Given 05/30/20 1239)  dexamethasone (DECADRON) injection 10 mg (10 mg Intramuscular Given 05/30/20 1251)    No orders of the defined types were placed in this encounter.  No results found for this or any previous visit (from the past 24 hour(s)). No results found.   ED Clinical Impression  1. Acute nonintractable headache, unspecified headache type     ED  Assessment/Plan   no sudden onset. Doubt SAH, ICH or space occupying lesion. Pt without fevers/chills, Pt has no meningeal sx, no nuchal rigidity. Doubt meningitis. Pt with normal neuro exam, no evidence of CVA/TIA.  Pt BP  not elevated significantly, doubt hypertensive emergency. No evidence of temporal artery tenderness, no evidence of glaucoma or other ocular pathology. Will give headache cocktail (dexamethasone 10 IM,  zofran 8 po, toradol 30 IM, Tylenol 1000 mg), and reassess.  Discussed with patient that if the headache has not changed despite this, she will need to go to the ED for further management.  Pt meets [facility] criteria (age greater than or equal to 31, no neck pain or stiffness, no witnessed LOC, no onset during exertion,  no maximum severity within 1 second and no resistance to neck flexion on exam).  Pt much improved after medications. Pt with continued non-focal neuro exam. Will d/c home with Tylenol/ibuprofen, Zofran.  To the ER if her headache returns.  Otherwise follow-up with PMD as needed.  Discussed  MDM, plan for follow up, signs and sx that should prompt return to ER. Pt agrees with plan  Meds ordered this encounter  Medications  . ketorolac (TORADOL) injection 30 mg  . ondansetron (ZOFRAN-ODT) disintegrating tablet 8 mg  . acetaminophen (TYLENOL) tablet 1,000 mg  . dexamethasone (DECADRON) injection 10 mg  . ibuprofen (ADVIL) 600 MG tablet    Sig: Take 1 tablet (600 mg total) by mouth every 6 (six) hours as needed.    Dispense:  30 tablet    Refill:  0  . ondansetron (ZOFRAN ODT) 8 MG disintegrating tablet    Sig: 1/2- 1 tablet q 8 hr prn nausea, vomiting    Dispense:  20 tablet    Refill:  0    *This clinic note was created using [employer]. Therefore, there may be occasional mistakes despite careful proofreading.  ?    Melynda Ripple, MD 05/30/20 1941

## 2020-05-30 NOTE — Discharge Instructions (Addendum)
Take extended milligrams of ibuprofen combined with 1000 mg of Tylenol together 3-4 times a day as needed.  Zofran with it, even if you are not nauseous.  Zofran may cause constipation.  Follow-up with your doctor in several days if you are not getting completely better, go immediately to the ER if your headache returns or gets worse.

## 2020-05-30 NOTE — ED Triage Notes (Signed)
Patient c/o headaches x 2 weeks, some light sensitivity, nausea, more on the top of the head in the middle.  Excedrin migraine w/o any relief.  Patient does wear glasses while on the computer daily, does work daily on computer.

## 2020-06-12 DIAGNOSIS — F32A Depression, unspecified: Secondary | ICD-10-CM | POA: Diagnosis not present

## 2020-06-12 DIAGNOSIS — Z6832 Body mass index (BMI) 32.0-32.9, adult: Secondary | ICD-10-CM | POA: Diagnosis not present

## 2020-06-12 DIAGNOSIS — Z13220 Encounter for screening for lipoid disorders: Secondary | ICD-10-CM | POA: Diagnosis not present

## 2020-06-12 DIAGNOSIS — E559 Vitamin D deficiency, unspecified: Secondary | ICD-10-CM | POA: Diagnosis not present

## 2020-06-12 DIAGNOSIS — D510 Vitamin B12 deficiency anemia due to intrinsic factor deficiency: Secondary | ICD-10-CM | POA: Diagnosis not present

## 2020-06-12 DIAGNOSIS — E669 Obesity, unspecified: Secondary | ICD-10-CM | POA: Diagnosis not present

## 2020-06-12 DIAGNOSIS — F419 Anxiety disorder, unspecified: Secondary | ICD-10-CM | POA: Diagnosis not present

## 2020-08-05 DIAGNOSIS — E669 Obesity, unspecified: Secondary | ICD-10-CM | POA: Diagnosis not present

## 2020-08-05 DIAGNOSIS — F329 Major depressive disorder, single episode, unspecified: Secondary | ICD-10-CM | POA: Diagnosis not present

## 2020-08-05 DIAGNOSIS — Z6831 Body mass index (BMI) 31.0-31.9, adult: Secondary | ICD-10-CM | POA: Diagnosis not present

## 2020-08-05 DIAGNOSIS — F410 Panic disorder [episodic paroxysmal anxiety] without agoraphobia: Secondary | ICD-10-CM | POA: Diagnosis not present

## 2020-08-22 ENCOUNTER — Encounter (HOSPITAL_COMMUNITY): Payer: Self-pay | Admitting: Emergency Medicine

## 2020-08-22 ENCOUNTER — Ambulatory Visit (HOSPITAL_COMMUNITY)
Admission: EM | Admit: 2020-08-22 | Discharge: 2020-08-22 | Disposition: A | Discharge status: Home / Self Care | Payer: No Typology Code available for payment source | Attending: Emergency Medicine | Admitting: Emergency Medicine

## 2020-08-22 ENCOUNTER — Other Ambulatory Visit: Payer: Self-pay

## 2020-08-22 DIAGNOSIS — M25561 Pain in right knee: Secondary | ICD-10-CM | POA: Diagnosis not present

## 2020-08-22 DIAGNOSIS — R0789 Other chest pain: Secondary | ICD-10-CM | POA: Diagnosis not present

## 2020-08-22 DIAGNOSIS — M545 Low back pain, unspecified: Secondary | ICD-10-CM

## 2020-08-22 DIAGNOSIS — M25562 Pain in left knee: Secondary | ICD-10-CM

## 2020-08-22 MED ORDER — IBUPROFEN 800 MG PO TABS: 800.0000 mg | ORAL_TABLET | Freq: Three times a day (TID) | ORAL | 0 refills | Status: DC | PRN

## 2020-08-22 MED ORDER — CYCLOBENZAPRINE HCL 10 MG PO TABS: 10.0000 mg | ORAL_TABLET | Freq: Every day | ORAL | 0 refills | Status: DC
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
## 2020-08-22 NOTE — Discharge Instructions (Addendum)
Can use for 100 mg of ibuprofen 3 times a day as needed for pain, it is recommended that you use it consistently at least for the next 3 to 5 days then you may use the medication as needed  Can use Flexeril as needed at bedtime for additional comfort  Can use heating pad over areas of discomfort in 15-minute intervals for additional comfort  As pain decreases please try to stretch your muscles to further help loosen them up  While working you may use the heating pad or a pillow for additional support and comfort  If pain continues to persist or worsens after at least 1 week of consistent medication use you may return to the urgent care or follow-up with orthopedic specialist, information is listed below, for evaluation
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
## 2020-10-16 DIAGNOSIS — F419 Anxiety disorder, unspecified: Secondary | ICD-10-CM | POA: Diagnosis not present

## 2020-10-16 DIAGNOSIS — F329 Major depressive disorder, single episode, unspecified: Secondary | ICD-10-CM | POA: Diagnosis not present

## 2020-10-16 DIAGNOSIS — G4700 Insomnia, unspecified: Secondary | ICD-10-CM | POA: Diagnosis not present

## 2020-10-16 DIAGNOSIS — F410 Panic disorder [episodic paroxysmal anxiety] without agoraphobia: Secondary | ICD-10-CM | POA: Diagnosis not present

## 2020-10-22 ENCOUNTER — Ambulatory Visit: Payer: Medicaid Other | Admitting: Cardiology

## 2020-11-12 ENCOUNTER — Emergency Department (HOSPITAL_COMMUNITY)
Admission: EM | Admit: 2020-11-12 | Discharge: 2020-11-12 | Disposition: A | Discharge status: Home / Self Care | Payer: No Typology Code available for payment source | Attending: Emergency Medicine | Admitting: Emergency Medicine

## 2020-11-12 ENCOUNTER — Encounter (HOSPITAL_COMMUNITY): Payer: Self-pay | Admitting: Emergency Medicine

## 2020-11-12 DIAGNOSIS — K0889 Other specified disorders of teeth and supporting structures: Secondary | ICD-10-CM | POA: Diagnosis not present

## 2020-11-12 DIAGNOSIS — F17210 Nicotine dependence, cigarettes, uncomplicated: Secondary | ICD-10-CM | POA: Diagnosis not present

## 2020-11-12 DIAGNOSIS — I10 Essential (primary) hypertension: Secondary | ICD-10-CM | POA: Insufficient documentation

## 2020-11-12 HISTORY — DX: Anemia, unspecified: D64.9

## 2020-11-12 HISTORY — DX: Depression, unspecified: F32.A

## 2020-11-12 HISTORY — DX: Vitamin D deficiency, unspecified: E55.9

## 2020-11-12 HISTORY — DX: Essential (primary) hypertension: I10

## 2020-11-12 HISTORY — DX: Anxiety disorder, unspecified: F41.9

## 2020-11-12 MED ORDER — BUPIVACAINE-EPINEPHRINE (PF) 0.5% -1:200000 IJ SOLN
1.8000 mL | Freq: Once | INTRAMUSCULAR | Status: AC
  Administered 2020-11-12: 1.8 mL
  Filled 2020-11-12: qty 1.8

## 2020-11-12 MED ORDER — OXYCODONE-ACETAMINOPHEN 5-325 MG PO TABS
2.0000 | ORAL_TABLET | Freq: Once | ORAL | Status: AC
Start: 2020-11-12 — End: 2020-11-12
  Administered 2020-11-12: 2 via ORAL
  Filled 2020-11-12: qty 2

## 2020-11-12 MED ORDER — KETOROLAC TROMETHAMINE 60 MG/2ML IM SOLN
60.0000 mg | Freq: Once | INTRAMUSCULAR | Status: AC
  Administered 2020-11-12: 60 mg via INTRAMUSCULAR
  Filled 2020-11-12: qty 2

## 2020-11-12 NOTE — ED Provider Notes (Signed)
[place] DEPT Provider Note   CSN: [id number] Arrival date & time: 11/12/20  2234     History Chief Complaint  Patient presents with   Dental Pain    Sheila Carr is a 40 y.o. female.   Dental Pain Location:  Lower Lower teeth location:  21/LL 1st bicuspid Quality:  Aching and sharp Severity:  Mild Duration:  2 days Timing:  Constant Chronicity:  New Context: crown fracture, dental fracture and poor dentition       Past Medical History:  Diagnosis Date   Anemia    Anxiety    Depression    Hypertension    Vitamin D deficiency     There are no problems to display for this patient.   History reviewed. No pertinent surgical history.   OB History   No obstetric history on file.     No family history on file.  Social History   Tobacco Use   Smoking status: Every Day    Types: Cigarettes   Smokeless tobacco: Never    Home Medications Prior to Admission medications   Medication Sig Start Date End Date Taking? Authorizing Provider  cyclobenzaprine (FLEXERIL) 10 MG tablet Take 1 tablet (10 mg total) by mouth at bedtime. 08/22/20   White, Leitha Schuller, NP  ibuprofen (ADVIL) 800 MG tablet Take 1 tablet (800 mg total) by mouth every 8 (eight) hours as needed. 08/22/20   Hans Eden, NP  ondansetron (ZOFRAN ODT) 8 MG disintegrating tablet 1/2- 1 tablet q 8 hr prn nausea, vomiting 05/30/20   Melynda Ripple, MD  sertraline (ZOLOFT) 50 MG tablet Take 1.5 tablets by mouth daily. 12/12/19   [provider]  VITAMIN D PO Take by mouth.    [provider]    Allergies    Iodine and Septra [sulfamethoxazole-trimethoprim]  Review of Systems   Review of Systems  All other systems reviewed and are negative.  Physical Exam Updated Vital Signs BP (!) 146/104 (BP Location: Right Arm)   Pulse 71   Temp 98.5 F (36.9 C) (Oral)   Resp 15   Ht 5\' 2"  (1.575 m)   Wt 78 kg   SpO2 98%   BMI 31.46 kg/m   Physical  Exam Vitals and nursing note reviewed.  Constitutional:      Appearance: She is well-developed.  HENT:     Head: Normocephalic and atraumatic.     Mouth/Throat:     Mouth: Mucous membranes are moist.     Pharynx: Oropharynx is clear.  Eyes:     Pupils: Pupils are equal, round, and reactive to light.  Cardiovascular:     Rate and Rhythm: Normal rate and regular rhythm.  Pulmonary:     Effort: Pulmonary effort is normal. No respiratory distress.     Breath sounds: No stridor.  Abdominal:     General: Abdomen is flat. There is no distension.  Musculoskeletal:        General: No swelling. Normal range of motion.     Cervical back: Normal range of motion.  Skin:    General: Skin is warm and dry.  Neurological:     General: No focal deficit present.     Mental Status: She is alert.    ED Results / Procedures / Treatments   Labs (all labs ordered are listed, but only abnormal results are displayed) Labs Reviewed - No data to display  EKG None  Radiology No results found.  Procedures Dental Block  Date/Time: 11/12/2020 11:32 PM Performed by: Merrily Pew, MD Authorized by: Merrily Pew, MD   Universal protocol:    Procedure explained and questions answered to patient or proxy's satisfaction: yes     Relevant documents present and verified: yes     Patient identity confirmed:  Verbally with patient, arm band, provided demographic data and anonymous protocol, patient vented/unresponsive Indications:    Indications: dental pain   Location:    Block type:  Supraperiosteal   Supraperiosteal location:  Lower teeth   Lower teeth location:  21/LL 1st bicuspid Procedure details:    Topical anesthetic:  Benzocaine gel   Syringe type:  Aspirating dental syringe   Needle gauge:  27 G   Anesthetic injected:  Bupivacaine 0.5% WITH epi   Injection procedure:  Anatomic landmarks identified, introduced needle, incremental injection and anatomic landmarks palpated   Medications  Ordered in ED Medications  bupivacaine-epinephrine (MARCAINE W/ EPI) 0.5% -1:200000 injection 1.8 mL (1.8 mLs Infiltration Given 11/12/20 2322)  ketorolac (TORADOL) injection 60 mg (60 mg Intramuscular Given 11/12/20 2321)  oxyCODONE-acetaminophen (PERCOCET/ROXICET) 5-325 MG per tablet 2 tablet (2 tablets Oral Given 11/12/20 2320)    ED Course  I have reviewed the triage vital signs and the nursing notes.  Pertinent labs & imaging results that were available during my care of the patient were reviewed by me and considered in my medical decision making (see chart for details).    MDM Rules/Calculators/A&P                         40 yo with dental pain likely related to fracture from grinding her teeth.plans to follow with dentist tomorrow. Tried oxycodone and ibuprofen with incomplete relief.   Near complete resolution of symptoms. Stable for discharge.   Final Clinical Impression(s) / ED Diagnoses Final diagnoses:  Pain, dental    Rx / DC Orders ED Discharge Orders     None        Jovontae Banko, Corene Cornea, MD 11/12/20 2333

## 2020-11-12 NOTE — ED Triage Notes (Signed)
Pt c/o left sided dental pain that started 2 hours ago. Denies fevers. Pt used tylenol, warm compress, lidocaine gel with no relief.

## 2020-11-13 DIAGNOSIS — Z98811 Dental restoration status: Secondary | ICD-10-CM | POA: Diagnosis not present

## 2020-12-16 DIAGNOSIS — E785 Hyperlipidemia, unspecified: Secondary | ICD-10-CM | POA: Diagnosis not present

## 2020-12-16 DIAGNOSIS — F329 Major depressive disorder, single episode, unspecified: Secondary | ICD-10-CM | POA: Diagnosis not present

## 2020-12-16 DIAGNOSIS — E559 Vitamin D deficiency, unspecified: Secondary | ICD-10-CM | POA: Diagnosis not present

## 2020-12-16 DIAGNOSIS — G4700 Insomnia, unspecified: Secondary | ICD-10-CM | POA: Diagnosis not present

## 2020-12-16 DIAGNOSIS — D510 Vitamin B12 deficiency anemia due to intrinsic factor deficiency: Secondary | ICD-10-CM | POA: Diagnosis not present

## 2020-12-16 DIAGNOSIS — J111 Influenza due to unidentified influenza virus with other respiratory manifestations: Secondary | ICD-10-CM | POA: Diagnosis not present

## 2021-04-01 ENCOUNTER — Encounter (HOSPITAL_COMMUNITY): Payer: Self-pay | Admitting: *Deleted

## 2021-04-01 ENCOUNTER — Other Ambulatory Visit: Payer: Self-pay

## 2021-04-01 ENCOUNTER — Emergency Department (HOSPITAL_COMMUNITY)
Admission: EM | Admit: 2021-04-01 | Discharge: 2021-04-02 | Disposition: A | Discharge status: Home / Self Care | Payer: No Typology Code available for payment source | Attending: Emergency Medicine | Admitting: Emergency Medicine

## 2021-04-01 ENCOUNTER — Emergency Department (HOSPITAL_COMMUNITY): Payer: No Typology Code available for payment source

## 2021-04-01 DIAGNOSIS — R519 Headache, unspecified: Secondary | ICD-10-CM | POA: Insufficient documentation

## 2021-04-01 DIAGNOSIS — R42 Dizziness and giddiness: Secondary | ICD-10-CM | POA: Diagnosis not present

## 2021-04-01 DIAGNOSIS — I10 Essential (primary) hypertension: Secondary | ICD-10-CM | POA: Diagnosis not present

## 2021-04-01 DIAGNOSIS — H81399 Other peripheral vertigo, unspecified ear: Secondary | ICD-10-CM | POA: Insufficient documentation

## 2021-04-01 DIAGNOSIS — R0789 Other chest pain: Secondary | ICD-10-CM | POA: Diagnosis not present

## 2021-04-01 LAB — COMPREHENSIVE METABOLIC PANEL
ALT: 16 U/L (ref 0–44)
AST: 18 U/L (ref 15–41)
Albumin: 4 g/dL (ref 3.5–5.0)
Alkaline Phosphatase: 58 U/L (ref 38–126)
Anion gap: 5 (ref 5–15)
BUN: 14 mg/dL (ref 6–20)
CO2: 27 mmol/L (ref 22–32)
Calcium: 9.3 mg/dL (ref 8.9–10.3)
Chloride: 107 mmol/L (ref 98–111)
Creatinine, Ser: 1.14 mg/dL — ABNORMAL HIGH (ref 0.44–1.00)
GFR, Estimated: >60 mL/min (ref >60)
Glucose, Bld: 80 mg/dL (ref 70–99)
Potassium: 4.3 mmol/L (ref 3.5–5.1)
Sodium: 139 mmol/L (ref 135–145)
Total Bilirubin: 0.6 mg/dL (ref 0.3–1.2)
Total Protein: 7.5 g/dL (ref 6.5–8.1)

## 2021-04-01 LAB — CBC WITH DIFFERENTIAL/PLATELET
Abs Immature Granulocytes: 0.01 10*3/uL (ref 0.00–0.07)
Basophils Absolute: 0 10*3/uL (ref 0.0–0.1)
Basophils Relative: 1 %
Eosinophils Absolute: 0.1 10*3/uL (ref 0.0–0.5)
Eosinophils Relative: 1 %
HCT: 42.5 % (ref 36.0–46.0)
Hemoglobin: 13.9 g/dL (ref 12.0–15.0)
Immature Granulocytes: 0 %
Lymphocytes Relative: 46 %
Lymphs Abs: 2.9 10*3/uL (ref 0.7–4.0)
MCH: 30.6 pg (ref 26.0–34.0)
MCHC: 32.7 g/dL (ref 30.0–36.0)
MCV: 93.6 fL (ref 80.0–100.0)
Monocytes Absolute: 0.5 10*3/uL (ref 0.1–1.0)
Monocytes Relative: 8 %
Neutro Abs: 2.9 10*3/uL (ref 1.7–7.7)
Neutrophils Relative %: 44 %
Platelets: 238 10*3/uL (ref 150–400)
RBC: 4.54 MIL/uL (ref 3.87–5.11)
RDW: 13.5 % (ref 11.5–15.5)
WBC: 6.4 10*3/uL (ref 4.0–10.5)
nRBC: 0 % (ref 0.0–0.2)

## 2021-04-01 LAB — URINALYSIS, ROUTINE W REFLEX MICROSCOPIC
Bilirubin Urine: NEGATIVE
Glucose, UA: NEGATIVE mg/dL
Hgb urine dipstick: NEGATIVE
Ketones, ur: NEGATIVE mg/dL
Nitrite: NEGATIVE
Protein, ur: NEGATIVE mg/dL
Specific Gravity, Urine: 1.025 (ref 1.005–1.030)
pH: 7 (ref 5.0–8.0)

## 2021-04-01 LAB — TSH: TSH: 2.687 u[IU]/mL (ref 0.350–4.500)

## 2021-04-01 LAB — TROPONIN I (HIGH SENSITIVITY): Troponin I (High Sensitivity): <2 ng/L (ref <18)

## 2021-04-01 NOTE — ED Provider Triage Note (Signed)
Emergency Medicine Provider Triage Evaluation Note  Sheila Carr , a 41 y.o. female  was evaluated in triage.  Pt complains of dizziness and nausea x 1 week.  Reports decrease energy.  Had negative covid test yesterday.  States that she feels SOB and winded when speaking.  Denies vaginal bleeding or rectal bleeding.  Review of Systems  Positive: Fatigue, dizzy Negative: Fever, chille  Physical Exam  BP (!) 122/93 (BP Location: Left Arm)    Pulse 69    Temp 97.7 F (36.5 C) (Oral)    Resp 18    Ht 5\' 2"  (1.575 m)    Wt 78.5 kg    SpO2 100%    BMI 31.64 kg/m  Gen:   Awake, no distress   Resp:  Normal effort  MSK:   Moves extremities without difficulty  Other:  CN 3-12 intact  Medical Decision Making  Medically screening exam initiated at 8:11 PM.  Appropriate orders placed.  Sheila Carr was informed that the remainder of the evaluation will be completed by another provider, this initial triage assessment does not replace that evaluation, and the importance of remaining in the ED until their evaluation is complete.  Dizziness, fatigue   Montine Circle, PA-C 04/01/21 2013

## 2021-04-01 NOTE — ED Triage Notes (Signed)
Headache and lightheadedness since last week. "Progressively worse"

## 2021-04-02 ENCOUNTER — Encounter (HOSPITAL_COMMUNITY): Payer: Self-pay | Admitting: Emergency Medicine

## 2021-04-02 DIAGNOSIS — H81399 Other peripheral vertigo, unspecified ear: Secondary | ICD-10-CM | POA: Diagnosis not present

## 2021-04-02 LAB — TROPONIN I (HIGH SENSITIVITY): Troponin I (High Sensitivity): <2 ng/L (ref <18)

## 2021-04-02 MED ORDER — LACTATED RINGERS IV BOLUS
1000.0000 mL | Freq: Once | INTRAVENOUS | Status: AC
  Administered 2021-04-02: 1000 mL via INTRAVENOUS

## 2021-04-02 MED ORDER — ONDANSETRON HCL 4 MG PO TABS: 4.0000 mg | ORAL_TABLET | Freq: Four times a day (QID) | ORAL | 0 refills | Status: AC | PRN

## 2021-04-02 MED ORDER — DEXAMETHASONE SODIUM PHOSPHATE 10 MG/ML IJ SOLN
10.0000 mg | Freq: Once | INTRAMUSCULAR | Status: AC
  Administered 2021-04-02: 10 mg via INTRAVENOUS
  Filled 2021-04-02: qty 1

## 2021-04-02 MED ORDER — PROCHLORPERAZINE EDISYLATE 10 MG/2ML IJ SOLN
10.0000 mg | Freq: Once | INTRAMUSCULAR | Status: AC
  Administered 2021-04-02: 10 mg via INTRAVENOUS
  Filled 2021-04-02: qty 2

## 2021-04-02 MED ORDER — MECLIZINE HCL 25 MG PO TABS: 25.0000 mg | ORAL_TABLET | Freq: Three times a day (TID) | ORAL | 0 refills | Status: AC | PRN

## 2021-04-02 MED ORDER — KETOROLAC TROMETHAMINE 30 MG/ML IJ SOLN
30.0000 mg | Freq: Once | INTRAMUSCULAR | Status: AC
  Administered 2021-04-02: 30 mg via INTRAVENOUS
  Filled 2021-04-02: qty 1

## 2021-04-02 MED ORDER — MECLIZINE HCL 25 MG PO TABS
25.0000 mg | ORAL_TABLET | Freq: Once | ORAL | Status: AC
  Administered 2021-04-02: 25 mg via ORAL
  Filled 2021-04-02: qty 1

## 2021-04-02 NOTE — ED Provider Notes (Signed)
[facility] DEPT Provider Note   CSN: [id number] Arrival date & time: 04/01/21  1930     History  Chief Complaint  Patient presents with   Headache    Sheila Carr is a 41 y.o. female.  The history is provided by the patient.  Headache She has history of hypertension and comes in because of dizziness and headache for the last week.  Her main complaint is the dizziness.  She has a sense of the room spinning with some associated nausea and vomiting.  Dizziness is worse when she stands and worse when she turns her head.  She denies ear pain, tinnitus, hearing loss.  She has also had a mild bifrontal headache which is dull.  Headache is partly improved after taking ibuprofen.  She denies fever, chills, sweats.  She denies any stiff neck.  She denies any incoordination.  Other than ibuprofen, she has not tried taking anything at home.  Of note, she is status post tubal ligation and endometrial ablation.   Home Medications Prior to Admission medications   Medication Sig Start Date End Date Taking? Authorizing Provider  cyclobenzaprine (FLEXERIL) 10 MG tablet Take 1 tablet (10 mg total) by mouth at bedtime. 08/22/20   White, Leitha Schuller, NP  ibuprofen (ADVIL) 800 MG tablet Take 1 tablet (800 mg total) by mouth every 8 (eight) hours as needed. 08/22/20   Hans Eden, NP  ondansetron (ZOFRAN ODT) 8 MG disintegrating tablet 1/2- 1 tablet q 8 hr prn nausea, vomiting 05/30/20   Melynda Ripple, MD  sertraline (ZOLOFT) 50 MG tablet Take 1.5 tablets by mouth daily. 12/12/19   [provider]  VITAMIN D PO Take by mouth.    [provider]      Allergies    Iodine and Septra [sulfamethoxazole-trimethoprim]    Review of Systems   Review of Systems  Neurological:  Positive for headaches.  All other systems reviewed and are negative.  Physical Exam Updated Vital Signs BP 129/82    Pulse 63    Temp 97.7 F (36.5 C) (Oral)    Resp 18    Ht 5'  2" (1.575 m)    Wt 78.5 kg    SpO2 100%    BMI 31.64 kg/m  Physical Exam Vitals and nursing note reviewed.  41 year old female, resting comfortably and in no acute distress. Vital signs are normal. Oxygen saturation is 100%, which is normal. Head is normocephalic and atraumatic. PERRLA, EOMI. Oropharynx is clear.  There is no nystagmus. Neck is nontender and supple without adenopathy or JVD. Back is nontender and there is no CVA tenderness. Lungs are clear without rales, wheezes, or rhonchi. Chest is nontender. Heart has regular rate and rhythm without murmur. Abdomen is soft, flat, nontender. Extremities have no cyanosis or edema, full range of motion is present. Skin is warm and dry without rash. Neurologic: Mental status is normal, cranial nerves are intact, moves all extremities equally.  Gait is normal.  Dizziness is reproduced by passive head movement.  ED Results / Procedures / Treatments   Labs (all labs ordered are listed, but only abnormal results are displayed) Labs Reviewed  COMPREHENSIVE METABOLIC PANEL - Abnormal; Notable for the following components:      Result Value   Creatinine, Ser 1.14 (*)    All other components within normal limits  URINALYSIS, ROUTINE W REFLEX MICROSCOPIC - Abnormal; Notable for the following components:   APPearance HAZY (*)    Leukocytes,Ua  TRACE (*)    Bacteria, UA FEW (*)    All other components within normal limits  CBC WITH DIFFERENTIAL/PLATELET  TSH  TROPONIN I (HIGH SENSITIVITY)  TROPONIN I (HIGH SENSITIVITY)    EKG EKG Interpretation  Date/Time:  Wednesday April 01 2021 20:23:46 EST Ventricular Rate:  72 PR Interval:  151 QRS Duration: 74 QT Interval:  354 QTC Calculation: 388 R Axis:   70 Text Interpretation: Sinus rhythm No old tracing to compare Confirmed by Daleen Bo [phone]) on 04/01/2021 8:46:12 PM  Radiology DG Chest 2 View  Result Date: 04/01/2021 CLINICAL DATA:  chest tightness EXAM: CHEST - 2 VIEW  COMPARISON:  None. FINDINGS: The heart and mediastinal contours are within normal limits. No focal consolidation. No pulmonary edema. No pleural effusion. No pneumothorax. No acute osseous abnormality. IMPRESSION: No active cardiopulmonary disease. Electronically Signed   By: Iven Finn M.D.   On: 04/01/2021 21:13    Procedures Procedures    Medications Ordered in ED Medications  lactated ringers bolus 1,000 mL (1,000 mLs Intravenous New Bag/Given 04/02/21 0221)  prochlorperazine (COMPAZINE) injection 10 mg (10 mg Intravenous Given 04/02/21 0219)  dexamethasone (DECADRON) injection 10 mg (10 mg Intravenous Given 04/02/21 0218)  ketorolac (TORADOL) 30 MG/ML injection 30 mg (30 mg Intravenous Given 04/02/21 0217)  meclizine (ANTIVERT) tablet 25 mg (25 mg Oral Given 04/02/21 0214)    ED Course/ Medical Decision Making/ A&P                           Medical Decision Making Risk Prescription drug management.   Dizziness which appears to be peripheral vertigo.  No red flags to suggest central vertigo.  Headache is nonspecific and I suspect it is actually related to her vertigo.  Old records are reviewed, and she does have a prior urgent care visit for headache which responded to headache cocktail.  At this point, I do not see any indication for CNS imaging.  Chest x-ray showed no acute process.  ECG is normal.  I have independently viewed the images, and agree with radiologist's interpretation.  CBC and metabolic panel are unremarkable.  TSH is normal.  Urinalysis is unremarkable.  She will be given a headache cocktail of lactated Ringer solution, ketorolac, prochlorperazine, dexamethasone and will also be given oral meclizine.  Following above-noted medication, headache is gone and dizziness is markedly improved.  She is discharged with prescriptions for ondansetron and meclizine.        Final Clinical Impression(s) / ED Diagnoses Final diagnoses:  Peripheral vertigo, unspecified  laterality  Nonintractable headache, unspecified chronicity pattern, unspecified headache type    Rx / DC Orders ED Discharge Orders          Ordered    ondansetron (ZOFRAN) 4 MG tablet  Every 6 hours PRN        04/02/21 0327    meclizine (ANTIVERT) 25 MG tablet  3 times daily PRN        04/02/21 7741              Delora Fuel, MD 42/39/53 [phone]

## 2021-04-02 NOTE — Discharge Instructions (Signed)
Take acetaminophen and/or ibuprofen as needed for headache.  Please be aware that if you take both acetaminophen and ibuprofen, you will get better pain relief and you get by taking either medication by itself.

## 2021-04-06 DIAGNOSIS — F411 Generalized anxiety disorder: Secondary | ICD-10-CM | POA: Diagnosis not present

## 2021-04-06 DIAGNOSIS — E785 Hyperlipidemia, unspecified: Secondary | ICD-10-CM | POA: Diagnosis not present

## 2021-04-06 DIAGNOSIS — F329 Major depressive disorder, single episode, unspecified: Secondary | ICD-10-CM | POA: Diagnosis not present

## 2021-04-06 DIAGNOSIS — E559 Vitamin D deficiency, unspecified: Secondary | ICD-10-CM | POA: Diagnosis not present

## 2021-04-06 DIAGNOSIS — E669 Obesity, unspecified: Secondary | ICD-10-CM | POA: Diagnosis not present

## 2021-04-06 DIAGNOSIS — R42 Dizziness and giddiness: Secondary | ICD-10-CM | POA: Diagnosis not present

## 2021-04-06 DIAGNOSIS — F410 Panic disorder [episodic paroxysmal anxiety] without agoraphobia: Secondary | ICD-10-CM | POA: Diagnosis not present

## 2021-04-06 DIAGNOSIS — G4700 Insomnia, unspecified: Secondary | ICD-10-CM | POA: Diagnosis not present

## 2021-04-06 DIAGNOSIS — D510 Vitamin B12 deficiency anemia due to intrinsic factor deficiency: Secondary | ICD-10-CM | POA: Diagnosis not present

## 2021-04-06 DIAGNOSIS — Z6830 Body mass index (BMI) 30.0-30.9, adult: Secondary | ICD-10-CM | POA: Diagnosis not present

## 2021-04-23 DIAGNOSIS — G4700 Insomnia, unspecified: Secondary | ICD-10-CM | POA: Diagnosis not present

## 2021-04-23 DIAGNOSIS — F410 Panic disorder [episodic paroxysmal anxiety] without agoraphobia: Secondary | ICD-10-CM | POA: Diagnosis not present

## 2021-04-23 DIAGNOSIS — F411 Generalized anxiety disorder: Secondary | ICD-10-CM | POA: Diagnosis not present

## 2021-04-23 DIAGNOSIS — F329 Major depressive disorder, single episode, unspecified: Secondary | ICD-10-CM | POA: Diagnosis not present

## 2021-06-05 DIAGNOSIS — F410 Panic disorder [episodic paroxysmal anxiety] without agoraphobia: Secondary | ICD-10-CM | POA: Diagnosis not present

## 2021-06-05 DIAGNOSIS — D510 Vitamin B12 deficiency anemia due to intrinsic factor deficiency: Secondary | ICD-10-CM | POA: Diagnosis not present

## 2021-06-05 DIAGNOSIS — E559 Vitamin D deficiency, unspecified: Secondary | ICD-10-CM | POA: Diagnosis not present

## 2021-06-05 DIAGNOSIS — E669 Obesity, unspecified: Secondary | ICD-10-CM | POA: Diagnosis not present

## 2021-06-05 DIAGNOSIS — R8290 Unspecified abnormal findings in urine: Secondary | ICD-10-CM | POA: Diagnosis not present

## 2021-06-05 DIAGNOSIS — F411 Generalized anxiety disorder: Secondary | ICD-10-CM | POA: Diagnosis not present

## 2021-06-05 DIAGNOSIS — G4700 Insomnia, unspecified: Secondary | ICD-10-CM | POA: Diagnosis not present

## 2021-06-05 DIAGNOSIS — F329 Major depressive disorder, single episode, unspecified: Secondary | ICD-10-CM | POA: Diagnosis not present

## 2021-06-05 DIAGNOSIS — Z6830 Body mass index (BMI) 30.0-30.9, adult: Secondary | ICD-10-CM | POA: Diagnosis not present

## 2021-06-05 DIAGNOSIS — E782 Mixed hyperlipidemia: Secondary | ICD-10-CM | POA: Diagnosis not present

## 2021-09-15 DIAGNOSIS — G4700 Insomnia, unspecified: Secondary | ICD-10-CM | POA: Diagnosis not present

## 2021-09-15 DIAGNOSIS — E6609 Other obesity due to excess calories: Secondary | ICD-10-CM | POA: Diagnosis not present

## 2021-09-15 DIAGNOSIS — F329 Major depressive disorder, single episode, unspecified: Secondary | ICD-10-CM | POA: Diagnosis not present

## 2021-09-15 DIAGNOSIS — Z6832 Body mass index (BMI) 32.0-32.9, adult: Secondary | ICD-10-CM | POA: Diagnosis not present

## 2021-09-15 DIAGNOSIS — E782 Mixed hyperlipidemia: Secondary | ICD-10-CM | POA: Diagnosis not present

## 2021-09-15 DIAGNOSIS — D510 Vitamin B12 deficiency anemia due to intrinsic factor deficiency: Secondary | ICD-10-CM | POA: Diagnosis not present

## 2021-09-15 DIAGNOSIS — F411 Generalized anxiety disorder: Secondary | ICD-10-CM | POA: Diagnosis not present

## 2021-09-15 DIAGNOSIS — Z833 Family history of diabetes mellitus: Secondary | ICD-10-CM | POA: Diagnosis not present

## 2021-09-15 DIAGNOSIS — F410 Panic disorder [episodic paroxysmal anxiety] without agoraphobia: Secondary | ICD-10-CM | POA: Diagnosis not present

## 2021-09-15 DIAGNOSIS — R42 Dizziness and giddiness: Secondary | ICD-10-CM | POA: Diagnosis not present

## 2021-09-15 DIAGNOSIS — R8279 Other abnormal findings on microbiological examination of urine: Secondary | ICD-10-CM | POA: Diagnosis not present

## 2021-09-15 DIAGNOSIS — E559 Vitamin D deficiency, unspecified: Secondary | ICD-10-CM | POA: Diagnosis not present

## 2021-11-01 ENCOUNTER — Emergency Department (HOSPITAL_COMMUNITY)
Admission: EM | Admit: 2021-11-01 | Discharge: 2021-11-01 | Disposition: A | Discharge status: Home / Self Care | Payer: Medicaid Other | Attending: Emergency Medicine | Admitting: Emergency Medicine

## 2021-11-01 ENCOUNTER — Encounter (HOSPITAL_COMMUNITY): Payer: Self-pay

## 2021-11-01 DIAGNOSIS — R059 Cough, unspecified: Secondary | ICD-10-CM | POA: Diagnosis not present

## 2021-11-01 DIAGNOSIS — J029 Acute pharyngitis, unspecified: Secondary | ICD-10-CM | POA: Diagnosis not present

## 2021-11-01 DIAGNOSIS — Z20822 Contact with and (suspected) exposure to covid-19: Secondary | ICD-10-CM | POA: Diagnosis not present

## 2021-11-01 DIAGNOSIS — R509 Fever, unspecified: Secondary | ICD-10-CM | POA: Insufficient documentation

## 2021-11-01 DIAGNOSIS — R0981 Nasal congestion: Secondary | ICD-10-CM | POA: Diagnosis not present

## 2021-11-01 LAB — SARS CORONAVIRUS 2 BY RT PCR: SARS Coronavirus 2 by RT PCR: NEGATIVE

## 2021-11-01 LAB — GROUP A STREP BY PCR: Group A Strep by PCR: NOT DETECTED

## 2021-11-01 MED ORDER — DEXAMETHASONE SODIUM PHOSPHATE 10 MG/ML IJ SOLN
10.0000 mg | Freq: Once | INTRAMUSCULAR | Status: AC
  Administered 2021-11-01: 10 mg via INTRAMUSCULAR
  Filled 2021-11-01: qty 1

## 2021-11-01 MED ORDER — AMOXICILLIN-POT CLAVULANATE 875-125 MG PO TABS: 1.0000 | ORAL_TABLET | Freq: Two times a day (BID) | ORAL | 0 refills | Status: DC

## 2021-11-01 NOTE — Discharge Instructions (Addendum)
I suspect you have strep pharyngitis.  The steroid given use in the ED should help with inflammation over the next few days.  Take the Augmentin twice daily with food and water.  Expect some diarrhea, that is normal and she still take the antibiotic.  If you have facial swelling, inability to eat or drink, or new concerning symptoms return back to the ED for evaluation.  Otherwise follow-up with ear nose and throat if you continue to have a sore throat back to the medication.  You can take Tylenol at home for the fevers.

## 2021-11-01 NOTE — ED Triage Notes (Signed)
Pt c/o two days of sore throat, cough, chills, headache.

## 2021-11-01 NOTE — ED Provider Notes (Signed)
[facility] DEPT Provider Note   CSN: [id number] Arrival date & time: 11/01/21  0844     History  Chief Complaint  Patient presents with   [place] is a 41 y.o. female.   Sore Throat     Patient presents due to 2 days of sore throat.  Worse when she swallows juice, she is also having fevers at home.  Endorses nonproductive cough, nasal congestion.  Denies dental pain, no usage of lisinopril or new medications, denies any hives, chest pain, shortness of breath.  She has 5 kids at home who are asymptomatic.  She is a Education officer, museum.  Home Medications Prior to Admission medications   Medication Sig Start Date End Date Taking? Authorizing Provider  buPROPion (WELLBUTRIN XL) 150 MG 24 hr tablet Take 150 mg by mouth daily. 12/23/20   [provider]  hydrOXYzine (ATARAX) 25 MG tablet Take 25 mg by mouth at bedtime. 12/23/20   [provider]  meclizine (ANTIVERT) 25 MG tablet Take 1 tablet (25 mg total) by mouth 3 (three) times daily as needed for dizziness. 7/82/42   Delora Fuel, MD  ondansetron (ZOFRAN) 4 MG tablet Take 1 tablet (4 mg total) by mouth every 6 (six) hours as needed for nausea. 3/53/61   Delora Fuel, MD  sertraline (ZOLOFT) 100 MG tablet Take 100 tablets by mouth daily. 12/12/19   [provider]  VITAMIN D PO Take 50,000 Units by mouth once a week. Sunday    [provider]      Allergies    Iodine, Shellfish allergy, and Septra [sulfamethoxazole-trimethoprim]    Review of Systems   Review of Systems  Physical Exam Updated Vital Signs BP 113/86 (BP Location: Left Arm)   Pulse 89   Temp 99.8 F (37.7 C) (Oral)   Resp 12   SpO2 100%  Physical Exam Vitals and nursing note reviewed. Exam conducted with a chaperone present.  Constitutional:      General: She is not in acute distress.    Appearance: Normal appearance.  HENT:     Head: Normocephalic and atraumatic.      Nose: Congestion present.     Mouth/Throat:     Pharynx: Uvula midline. Posterior oropharyngeal erythema present.     Tonsils: Tonsillar exudate present. 2+ on the right. 2+ on the left.     Comments: Uvula is midline, exudate to right tonsil.  Protecting airway, handling secretions.  No trismus, no tenderness to percussion of dentition Eyes:     General: No scleral icterus.       Right eye: No discharge.        Left eye: No discharge.     Extraocular Movements: Extraocular movements intact.     Pupils: Pupils are equal, round, and reactive to light.  Cardiovascular:     Rate and Rhythm: Normal rate and regular rhythm.     Pulses: Normal pulses.     Heart sounds: Normal heart sounds. No murmur heard.    No friction rub. No gallop.  Pulmonary:     Effort: Pulmonary effort is normal. No respiratory distress.     Breath sounds: Normal breath sounds.  Abdominal:     General: Abdomen is flat. Bowel sounds are normal. There is no distension.     Palpations: Abdomen is soft.     Tenderness: There is no abdominal tenderness.  Skin:    General: Skin is warm and dry.  Coloration: Skin is not jaundiced.  Neurological:     Mental Status: She is alert. Mental status is at baseline.     Coordination: Coordination normal.     ED Results / Procedures / Treatments   Labs (all labs ordered are listed, but only abnormal results are displayed) Labs Reviewed  SARS CORONAVIRUS 2 BY RT PCR  GROUP A STREP BY PCR    EKG None  Radiology No results found.  Procedures Procedures    Medications Ordered in ED Medications  dexamethasone (DECADRON) injection 10 mg (has no administration in time range)    ED Course/ Medical Decision Making/ A&P                           Medical Decision Making Risk Prescription drug management.   Patient presents due to sore throat x2 days.  Differential includes not limited to potential abscess, retropharyngeal abscess, viral pharyngitis, strep  pharyngitis, Ludewig angina, atypical angioedema, atypical anaphylaxis.  On exam patient clinically appears consistent with strep given there is exudates of the right tonsil.  Uvula is midline, no hot potato voice or trismus.  No sublingual tenderness or swelling, normal phonation and protecting airway.  She is not technically febrile but her temperature is elevated 99.8 and her heart rate is regular at 89.  Lungs are clear to auscultation, does not appear septic on exam.  Exam is not consistent with peritonsillar abscess, retropharyngeal abscess, Ludwig angina, angioedema, anaphylaxis.  I suspect patient has viral pharyngitis versus strep pharyngitis, clinically we will proceed to treat with IM Decadron and 7-day course of Augmentin with close follow-up.  ENT referral provided, strict return precautions were discussed with the patient who verbalized understanding.  Patient discharged stable condition.        Final Clinical Impression(s) / ED Diagnoses Final diagnoses:  None    Rx / DC Orders ED Discharge Orders     None         Sherrill Raring, [place] 11/01/21 1603    Hayden Rasmussen, MD 11/01/21 [phone]

## 2022-02-05 DIAGNOSIS — Z5321 Procedure and treatment not carried out due to patient leaving prior to being seen by health care provider: Secondary | ICD-10-CM | POA: Insufficient documentation

## 2022-02-05 DIAGNOSIS — I10 Essential (primary) hypertension: Secondary | ICD-10-CM | POA: Insufficient documentation

## 2022-02-05 DIAGNOSIS — R509 Fever, unspecified: Secondary | ICD-10-CM | POA: Diagnosis present

## 2022-02-05 DIAGNOSIS — U071 COVID-19: Secondary | ICD-10-CM | POA: Insufficient documentation

## 2022-02-05 DIAGNOSIS — Z79899 Other long term (current) drug therapy: Secondary | ICD-10-CM | POA: Insufficient documentation

## 2022-02-06 ENCOUNTER — Emergency Department (HOSPITAL_BASED_OUTPATIENT_CLINIC_OR_DEPARTMENT_OTHER)
Admission: EM | Admit: 2022-02-06 | Discharge: 2022-02-06 | Disposition: A | Discharge status: Home / Self Care | Payer: Medicaid Other | Source: Home / Self Care | Attending: Emergency Medicine | Admitting: Emergency Medicine

## 2022-02-06 ENCOUNTER — Other Ambulatory Visit: Payer: Self-pay

## 2022-02-06 ENCOUNTER — Emergency Department (HOSPITAL_COMMUNITY)
Admission: EM | Admit: 2022-02-06 | Discharge: 2022-02-06 | Discharge status: Against Medical Advice | Payer: Medicaid Other | Attending: Emergency Medicine | Admitting: Emergency Medicine

## 2022-02-06 DIAGNOSIS — Z79899 Other long term (current) drug therapy: Secondary | ICD-10-CM | POA: Insufficient documentation

## 2022-02-06 DIAGNOSIS — I10 Essential (primary) hypertension: Secondary | ICD-10-CM | POA: Insufficient documentation

## 2022-02-06 DIAGNOSIS — U071 COVID-19: Secondary | ICD-10-CM | POA: Insufficient documentation

## 2022-02-06 LAB — RESP PANEL BY RT-PCR (RSV, FLU A&B, COVID)  RVPGX2
Influenza A by PCR: NEGATIVE
Influenza B by PCR: NEGATIVE
Resp Syncytial Virus by PCR: NEGATIVE
SARS Coronavirus 2 by RT PCR: POSITIVE — AB

## 2022-02-06 MED ORDER — IPRATROPIUM-ALBUTEROL 0.5-2.5 (3) MG/3ML IN SOLN
3.0000 mL | Freq: Once | RESPIRATORY_TRACT | Status: AC
  Administered 2022-02-06: 3 mL via RESPIRATORY_TRACT

## 2022-02-06 MED ORDER — AEROCHAMBER PLUS FLO-VU LARGE MISC
1.0000 | Freq: Once | Status: AC
  Administered 2022-02-06: 1
  Filled 2022-02-06: qty 1

## 2022-02-06 MED ORDER — IPRATROPIUM-ALBUTEROL 0.5-2.5 (3) MG/3ML IN SOLN
RESPIRATORY_TRACT | Status: AC
  Filled 2022-02-06: qty 3

## 2022-02-06 MED ORDER — ALBUTEROL SULFATE HFA 108 (90 BASE) MCG/ACT IN AERS
2.0000 | INHALATION_SPRAY | RESPIRATORY_TRACT | Status: DC | PRN
  Administered 2022-02-06: 2 via RESPIRATORY_TRACT

## 2022-02-06 MED ORDER — ALBUTEROL SULFATE HFA 108 (90 BASE) MCG/ACT IN AERS
INHALATION_SPRAY | RESPIRATORY_TRACT | Status: AC
  Filled 2022-02-06: qty 6.7

## 2022-02-06 NOTE — ED Notes (Signed)
RT educated pt on proper use of MDI w/spacer. Pt able to perform w/out difficulty. Respiratory status stable on RA w/no distress noted.

## 2022-02-06 NOTE — ED Notes (Signed)
Patient called from lobby for room placement x 3 without answering.  RN unable to locate patient

## 2022-02-06 NOTE — Discharge Instructions (Addendum)
You tested positive for COVID-19.  Make sure to drink plenty of fluids, take Tylenol 1 g every 6-8 hours and ibuprofen 600 mg every 6 hours as needed for pain or bodyaches or fever.  You can use the albuterol inhaler every 4-6 hours as needed for wheezing or shortness of breath.  Follow-up with your doctor regarding your visit to the ER today.  Come back to the ER if you are having any severe worsening difficulty breathing, severe chest pain, uncontrollable vomiting, inability to walk or get around, or any other symptoms concerning to you.

## 2022-02-06 NOTE — ED Triage Notes (Signed)
Patient presents from home, states she was seen at [facility] earlier today but left without receiving results AMA. Results show she has covid. Patient is aaxo4, ambulatory. CC of Flu like symptoms.

## 2022-02-06 NOTE — ED Provider Notes (Signed)
[facility] EMER[facility]ENC[facility] DEPT Provider Note   CSN: [id number] Arrival date & time: 02/06/22  5784     History  Chief Complaint  Patient presents with   Cough    Sheila Carr is a 41 y.o. female.  With PMH of anemia, anxiety, HTN presenting with viral symptoms x 2-3 days.  Tested positive for COVID late last night when tested at outside facility but left before getting results.  Patient's symptoms started about 2 to 3 days ago.  She has been complaining of generalized bodyaches, cough, congestion and feeling warm with subjective fevers.  She has had no chest pain or difficulty breathing.  She has been taking ibuprofen and over-the-counter flu medicine without relief.  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   Cough      Home Medications Prior to Admission medications   Medication Sig Start Date End Date Taking? Authorizing Provider  amoxicillin-clavulanate (AUGMENTIN) 875-125 MG tablet Take 1 tablet by mouth every 12 (twelve) hours. 11/01/21   Sherrill Raring, PA-C  buPROPion (WELLBUTRIN XL) 150 MG 24 hr tablet Take 150 mg by mouth daily. 12/23/20   [provider]  hydrOXYzine (ATARAX) 25 MG tablet Take 25 mg by mouth at bedtime. 12/23/20   [provider]  meclizine (ANTIVERT) 25 MG tablet Take 1 tablet (25 mg total) by mouth 3 (three) times daily as needed for dizziness. 6/96/29   Delora Fuel, MD  ondansetron (ZOFRAN) 4 MG tablet Take 1 tablet (4 mg total) by mouth every 6 (six) hours as needed for nausea. [birth year]39   Delora Fuel, MD  sertraline (ZOLOFT) 100 MG tablet Take 100 tablets by mouth daily. 12/12/19   [provider]  VITAMIN D PO Take 50,000 Units by mouth once a week. Sunday    [provider]      Allergies    Iodine, Shellfish allergy, and Septra [sulfamethoxazole-trimethoprim]    Review of Systems    Review of Systems  Respiratory:  Positive for cough.     Physical Exam Updated Vital Signs BP 126/86 (BP Location: Right Arm)   Pulse 70   Temp 98.4 F (36.9 C) (Oral)   Resp 18   Ht '5\' 2"'$  (1.575 m)   Wt 80.7 kg   SpO2 96%   BMI 32.56 kg/m  Physical Exam Constitutional: Alert and oriented. Well appearing and in no distress. Eyes: Conjunctivae are normal. ENT      Head: Normocephalic and atraumatic.      Nose: No congestion.      Mouth/Throat: Mucous membranes are moist.      Neck: No stridor. Cardiovascular: S1, S2, regular rate.Warm and well perfused. Respiratory: Normal respiratory effort. Breath sounds are normal.  O2 sat 96-100 on RA Gastrointestinal: Soft and nondistended Musculoskeletal: Normal range of motion in all extremities. Neurologic: Normal speech and language.  No facial droop.  Moving all extremities equally.  Sensation grossly intact.  Steady ambulatory gait.  No gross focal neurologic deficits are appreciated. Skin: Skin is warm, dry and intact. No rash noted. Psychiatric: Mood and affect are normal. Speech and behavior are normal.  ED Results / Procedures / Treatments   Labs (all labs ordered are listed, but only abnormal results are displayed) Labs Reviewed - No data to display  EKG None  Radiology No results found.  Procedures Procedures    Medications  Ordered in ED Medications  albuterol (VENTOLIN HFA) 108 (90 Base) MCG/ACT inhaler 2 puff (2 puffs Inhalation Not Given 02/06/22 0714)  ipratropium-albuterol (DUONEB) 0.5-2.5 (3) MG/3ML nebulizer solution 3 mL (3 mLs Nebulization Not Given 02/06/22 5427)  AeroChamber Plus Flo-Vu Large MISC 1 each (1 each Other Given 02/06/22 0703)    ED Course/ Medical Decision Making/ A&P                           Medical Decision Making Sheila Carr is a 41 y.o. female.  With PMH of anemia, anxiety, HTN presenting with viral symptoms x 2-3 days.   Overall, this is a well appearing patient presenting  with mild viral-like symptoms, perhaps secondary to COVID, influenza, or any other non-specific virus specially given high prevalence of disease in surrounding community.   Patient seen at outside facility in our system and tested positive for COVID-19 last night.  Vitals as documented. On exam, normal work of breathing. Speaking in full sentences without difficulty. No respiratory distress and overall non-toxic appearing. No evidence of hypoxia.  Based on the patient's medical screening exam, including their history, vitals, and physical exam, the patient is stable for further outpatient evaluation of their symptoms. There is no evidence of respiratory distress, systemic toxicity, hemodynamic compromise, or emergent medical condition.  Therefore, I do not feel imaging or laboratory work is required beyond the viral swabs.  Regarding my thought process, this patient has signs and symptoms consistent with a viral syndrome. There is no evidence to suggest serious bacterial infection at this time, including bacteremia. There is no stridor or difficulty handling secretions to suggest a severe bacterial upper respiratory illness. Signs, symptoms and clinical appearance are not consistent with meningitis or encephalitis. No risk factors for bacterial endocarditis (like IVDU, indwelling catheter, prosthetic heart valve, ESRD), and no known history of valvular abnormality. At this time I do not believe this to be pericarditis or myocarditis given predominant respiratory symptoms and no signs of fluid overload.   Results and decision making discussed in depth with the patient. I discussed plan to discharge the patient and the important need for follow up.  Advised continued supportive care and patient provided with albuterol inhaler by RT upon arrival however no wheezing on my exam.  They can continue Tylenol ibuprofen p.o. hydration and albuterol as needed.  They agree with plan. I discussed strict return  precautions with them, which were included in my discharge instructions, and they understand and agree to come back to the Emergency Department if their symptoms change/worsen. They express understanding, and patient with discharged in stable condition.      Final Clinical Impression(s) / ED Diagnoses Final diagnoses:  [id number]    Rx / DC Orders ED Discharge Orders     None         Elgie Congo, MD 02/06/22 [phone]

## 2022-02-06 NOTE — ED Triage Notes (Signed)
Patient coming to ED for evaluation of flu like symptoms.  States symptoms started yesterday.  Has had fever, body aches, sore throat, and generalized body aches.

## 2022-03-23 DIAGNOSIS — Z6832 Body mass index (BMI) 32.0-32.9, adult: Secondary | ICD-10-CM | POA: Diagnosis not present

## 2022-03-23 DIAGNOSIS — Z124 Encounter for screening for malignant neoplasm of cervix: Secondary | ICD-10-CM | POA: Diagnosis not present

## 2022-03-23 DIAGNOSIS — D510 Vitamin B12 deficiency anemia due to intrinsic factor deficiency: Secondary | ICD-10-CM | POA: Diagnosis not present

## 2022-03-23 DIAGNOSIS — F331 Major depressive disorder, recurrent, moderate: Secondary | ICD-10-CM | POA: Diagnosis not present

## 2022-03-23 DIAGNOSIS — E6609 Other obesity due to excess calories: Secondary | ICD-10-CM | POA: Diagnosis not present

## 2022-03-23 DIAGNOSIS — F411 Generalized anxiety disorder: Secondary | ICD-10-CM | POA: Diagnosis not present

## 2022-03-23 DIAGNOSIS — R8279 Other abnormal findings on microbiological examination of urine: Secondary | ICD-10-CM | POA: Diagnosis not present

## 2022-03-23 DIAGNOSIS — Z111 Encounter for screening for respiratory tuberculosis: Secondary | ICD-10-CM | POA: Diagnosis not present

## 2022-03-23 DIAGNOSIS — F410 Panic disorder [episodic paroxysmal anxiety] without agoraphobia: Secondary | ICD-10-CM | POA: Diagnosis not present

## 2022-03-23 DIAGNOSIS — E782 Mixed hyperlipidemia: Secondary | ICD-10-CM | POA: Diagnosis not present

## 2022-03-23 DIAGNOSIS — G4700 Insomnia, unspecified: Secondary | ICD-10-CM | POA: Diagnosis not present

## 2022-03-23 DIAGNOSIS — E559 Vitamin D deficiency, unspecified: Secondary | ICD-10-CM | POA: Diagnosis not present

## 2022-03-23 DIAGNOSIS — R8290 Unspecified abnormal findings in urine: Secondary | ICD-10-CM | POA: Diagnosis not present

## 2022-04-10 ENCOUNTER — Emergency Department
Admission: EM | Admit: 2022-04-10 | Discharge: 2022-04-10 | Disposition: A | Discharge status: Home / Self Care | Payer: Medicaid Other | Attending: Emergency Medicine | Admitting: Emergency Medicine

## 2022-04-10 ENCOUNTER — Emergency Department: Payer: Medicaid Other

## 2022-04-10 ENCOUNTER — Other Ambulatory Visit: Payer: Self-pay

## 2022-04-10 DIAGNOSIS — Q324 Other congenital malformations of bronchus: Secondary | ICD-10-CM

## 2022-04-10 DIAGNOSIS — I10 Essential (primary) hypertension: Secondary | ICD-10-CM | POA: Insufficient documentation

## 2022-04-10 DIAGNOSIS — R000 Tachycardia, unspecified: Secondary | ICD-10-CM | POA: Diagnosis not present

## 2022-04-10 DIAGNOSIS — S0990XA Unspecified injury of head, initial encounter: Secondary | ICD-10-CM | POA: Diagnosis not present

## 2022-04-10 DIAGNOSIS — J9809 Other diseases of bronchus, not elsewhere classified: Secondary | ICD-10-CM | POA: Diagnosis not present

## 2022-04-10 DIAGNOSIS — D269 Other benign neoplasm of uterus, unspecified: Secondary | ICD-10-CM | POA: Insufficient documentation

## 2022-04-10 DIAGNOSIS — N858 Other specified noninflammatory disorders of uterus: Secondary | ICD-10-CM

## 2022-04-10 DIAGNOSIS — K769 Liver disease, unspecified: Secondary | ICD-10-CM

## 2022-04-10 DIAGNOSIS — K7689 Other specified diseases of liver: Secondary | ICD-10-CM | POA: Insufficient documentation

## 2022-04-10 DIAGNOSIS — R911 Solitary pulmonary nodule: Secondary | ICD-10-CM | POA: Diagnosis not present

## 2022-04-10 DIAGNOSIS — Y92410 Unspecified street and highway as the place of occurrence of the external cause: Secondary | ICD-10-CM | POA: Insufficient documentation

## 2022-04-10 DIAGNOSIS — S3991XA Unspecified injury of abdomen, initial encounter: Secondary | ICD-10-CM | POA: Diagnosis not present

## 2022-04-10 DIAGNOSIS — R0902 Hypoxemia: Secondary | ICD-10-CM | POA: Diagnosis not present

## 2022-04-10 DIAGNOSIS — R918 Other nonspecific abnormal finding of lung field: Secondary | ICD-10-CM | POA: Diagnosis not present

## 2022-04-10 DIAGNOSIS — S0993XA Unspecified injury of face, initial encounter: Secondary | ICD-10-CM | POA: Diagnosis not present

## 2022-04-10 LAB — CBC WITH DIFFERENTIAL/PLATELET
Abs Immature Granulocytes: 0.01 10*3/uL (ref 0.00–0.07)
Basophils Absolute: 0 10*3/uL (ref 0.0–0.1)
Basophils Relative: 1 %
Eosinophils Absolute: 0 10*3/uL (ref 0.0–0.5)
Eosinophils Relative: 0 %
HCT: 42 % (ref 36.0–46.0)
Hemoglobin: 13.8 g/dL (ref 12.0–15.0)
Immature Granulocytes: 0 %
Lymphocytes Relative: 17 %
Lymphs Abs: 1 10*3/uL (ref 0.7–4.0)
MCH: 30.5 pg (ref 26.0–34.0)
MCHC: 32.9 g/dL (ref 30.0–36.0)
MCV: 92.7 fL (ref 80.0–100.0)
Monocytes Absolute: 0.4 10*3/uL (ref 0.1–1.0)
Monocytes Relative: 6 %
Neutro Abs: 4.8 10*3/uL (ref 1.7–7.7)
Neutrophils Relative %: 76 %
Platelets: 253 10*3/uL (ref 150–400)
RBC: 4.53 MIL/uL (ref 3.87–5.11)
RDW: 13.7 % (ref 11.5–15.5)
Smear Review: NORMAL
WBC: 6.3 10*3/uL (ref 4.0–10.5)
nRBC: 0 % (ref 0.0–0.2)

## 2022-04-10 LAB — COMPREHENSIVE METABOLIC PANEL
ALT: 12 U/L (ref 0–44)
AST: 18 U/L (ref 15–41)
Albumin: 3.9 g/dL (ref 3.5–5.0)
Alkaline Phosphatase: 51 U/L (ref 38–126)
Anion gap: 6 (ref 5–15)
BUN: 8 mg/dL (ref 6–20)
CO2: 24 mmol/L (ref 22–32)
Calcium: 9 mg/dL (ref 8.9–10.3)
Chloride: 105 mmol/L (ref 98–111)
Creatinine, Ser: 1.05 mg/dL — ABNORMAL HIGH (ref 0.44–1.00)
GFR, Estimated: >60 mL/min (ref >60)
Glucose, Bld: 108 mg/dL — ABNORMAL HIGH (ref 70–99)
Potassium: 3.6 mmol/L (ref 3.5–5.1)
Sodium: 135 mmol/L (ref 135–145)
Total Bilirubin: 1 mg/dL (ref 0.3–1.2)
Total Protein: 7.3 g/dL (ref 6.5–8.1)

## 2022-04-10 LAB — POC URINE PREG, ED: Preg Test, Ur: NEGATIVE

## 2022-04-10 MED ORDER — EPINEPHRINE 0.3 MG/0.3ML IJ SOAJ
INTRAMUSCULAR | Status: AC
  Filled 2022-04-10: qty 0.3

## 2022-04-10 MED ORDER — HYDROMORPHONE HCL 1 MG/ML IJ SOLN
0.5000 mg | Freq: Once | INTRAMUSCULAR | Status: AC
  Administered 2022-04-10: 0.5 mg via INTRAVENOUS
  Filled 2022-04-10: qty 0.5

## 2022-04-10 MED ORDER — METHYLPREDNISOLONE SODIUM SUCC 125 MG IJ SOLR
125.0000 mg | Freq: Once | INTRAMUSCULAR | Status: AC
  Administered 2022-04-10: 125 mg via INTRAVENOUS
  Filled 2022-04-10: qty 2

## 2022-04-10 MED ORDER — DIPHENHYDRAMINE HCL 50 MG/ML IJ SOLN
25.0000 mg | Freq: Once | INTRAMUSCULAR | Status: AC
  Administered 2022-04-10: 25 mg via INTRAVENOUS
  Filled 2022-04-10: qty 1

## 2022-04-10 MED ORDER — IOHEXOL 300 MG/ML  SOLN
100.0000 mL | Freq: Once | INTRAMUSCULAR | Status: AC | PRN
  Administered 2022-04-10: 80 mL via INTRAVENOUS
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
## 2022-04-10 NOTE — Discharge Instructions (Addendum)
Your CAT scan did not show any likely traumatic injuries.  You are likely to be more sore tomorrow you can take Tylenol Motrin for pain.  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  Please follow-up with either your primary care doctor or the lung doctors regarding this.   We are also seeing an area of abnormality on your liver which is likely just fat but you should have an ultrasound of your liver as an outpatient to ensure there is no persistent lesion there.  Is also an area of abnormality in your uterus which is likely a fibroid but you will need to have an ultrasound of your pelvis as an outpatient.  We are also seeing some small nodules on your lungs.  They should be followed up with a repeat CAT scan in about 1 year.  If you are developing increasing chest pain coughing up blood or develop fevers or shortness of breath please return to the emergency department.
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
## 2022-04-13 DIAGNOSIS — N858 Other specified noninflammatory disorders of uterus: Secondary | ICD-10-CM | POA: Diagnosis not present

## 2022-04-13 DIAGNOSIS — R52 Pain, unspecified: Secondary | ICD-10-CM | POA: Diagnosis not present

## 2022-04-13 DIAGNOSIS — F410 Panic disorder [episodic paroxysmal anxiety] without agoraphobia: Secondary | ICD-10-CM | POA: Diagnosis not present

## 2022-04-13 DIAGNOSIS — Q324 Other congenital malformations of bronchus: Secondary | ICD-10-CM | POA: Diagnosis not present

## 2022-04-13 DIAGNOSIS — R911 Solitary pulmonary nodule: Secondary | ICD-10-CM | POA: Diagnosis not present

## 2022-04-13 DIAGNOSIS — R160 Hepatomegaly, not elsewhere classified: Secondary | ICD-10-CM | POA: Diagnosis not present

## 2022-08-24 ENCOUNTER — Emergency Department (HOSPITAL_BASED_OUTPATIENT_CLINIC_OR_DEPARTMENT_OTHER)
Admission: EM | Admit: 2022-08-24 | Discharge: 2022-08-24 | Disposition: A | Discharge status: Home / Self Care | Payer: Medicaid Other | Attending: Emergency Medicine | Admitting: Emergency Medicine

## 2022-08-24 ENCOUNTER — Encounter (HOSPITAL_BASED_OUTPATIENT_CLINIC_OR_DEPARTMENT_OTHER): Payer: Self-pay

## 2022-08-24 ENCOUNTER — Other Ambulatory Visit: Payer: Self-pay

## 2022-08-24 DIAGNOSIS — U071 COVID-19: Secondary | ICD-10-CM | POA: Insufficient documentation

## 2022-08-24 DIAGNOSIS — R509 Fever, unspecified: Secondary | ICD-10-CM | POA: Diagnosis present

## 2022-08-24 LAB — RESP PANEL BY RT-PCR (RSV, FLU A&B, COVID)  RVPGX2
Influenza A by PCR: NEGATIVE
Influenza B by PCR: NEGATIVE
Resp Syncytial Virus by PCR: NEGATIVE
SARS Coronavirus 2 by RT PCR: POSITIVE — AB

## 2022-08-24 MED ORDER — PAXLOVID (300/100) 20 X 150 MG & 10 X 100MG PO TBPK: 3.0000 | ORAL_TABLET | Freq: Two times a day (BID) | ORAL | 0 refills | Status: AC

## 2022-08-24 MED ORDER — BENZONATATE 100 MG PO CAPS: 100.0000 mg | ORAL_CAPSULE | Freq: Three times a day (TID) | ORAL | 0 refills | Status: AC

## 2022-08-24 NOTE — Discharge Instructions (Signed)
Please read and follow all provided instructions.  Your diagnoses today include:  1. COVID-19     Tests performed today include: Vital signs. See below for your results today.  COVID test - was positive  Medications prescribed:  Paxlovid  Tessalon Perles - cough suppressant medication  Take any prescribed medications only as directed. Treatment for your infection is aimed at treating the symptoms. There are no medications, such as antibiotics, that will cure your infection.   Home care instructions:  Follow any educational materials contained in this packet.   Your illness is contagious and can be spread to others, especially during the first 3 or 4 days. It cannot be cured by antibiotics or other medicines. Take basic precautions such as washing your hands often, covering your mouth when you cough or sneeze, and avoiding public places where you could spread your illness to others.   Please continue drinking plenty of fluids.  Use over-the-counter medicines as needed as directed on packaging for symptom relief.  You may also use ibuprofen or tylenol as directed on packaging for pain or fever.  Do not take multiple medicines containing Tylenol or acetaminophen to avoid taking too much of this medication.  If you are positive for Covid-19, you should isolate yourself and not be exposed to other people for 5 days after your symptoms began. If you are not feeling better at day 5, you need to isolate yourself for a total of 10 days. If you are feeling better by day 5, you should wear a mask properly, over your nose and mouth, at all times while around other people until 10 days after your symptoms started.   Follow-up instructions: Please follow-up with your primary care provider as needed for further evaluation of your symptoms if you are not feeling better.   Return instructions:  Please return to the Emergency Department if you experience worsening symptoms.  Return to the emergency  department if you have worsening shortness of breath breathing or increased work of breathing, persistent vomiting RETURN IMMEDIATELY IF you develop shortness of breath, confusion or altered mental status, a new rash, become dizzy, faint, or poorly responsive, or are unable to be cared for at home. Please return if you have persistent vomiting and cannot keep down fluids or develop a fever that is not controlled by tylenol or motrin.   Please return if you have any other emergent concerns.  Additional Information:  Your vital signs today were: BP (!) 116/99 (BP Location: Right Arm)   Pulse (!) 103   Temp 99.6 F (37.6 C) (Oral)   Resp 16   Ht 5\' 2"  (1.575 m)   Wt 80.7 kg   SpO2 99%   BMI 32.56 kg/m  If your blood pressure (BP) was elevated above 135/85 this visit, please have this repeated by your doctor within one month. --------------

## 2022-08-24 NOTE — ED Triage Notes (Signed)
Patient here POV from Home.  Endorses Headache. Sinus Pressure, Drainage, Cough, Aches, fever that began 2 Days ago. Cough mostly productive. No Sore Throat.   NAD noted during Triage. A&Ox4. GCS 15. Ambulatory.

## 2022-08-24 NOTE — ED Provider Notes (Signed)
[place] EMERGENCY DEPARTMENT AT [facility] Provider Note   CSN: [id number] Arrival date & time: 08/24/22  2016     History  Chief Complaint  Patient presents with   Cough    Sheila Carr is a 42 y.o. female.  Patient presents to the emergency department today for evaluation of fever, body aches, cough over the past 2 days.  She has had some mild congestion and sinus pressure as well.  No vomiting or diarrhea.  No significant sore throat.  No known sick contacts.  She states that she wants to feel better because her child is having their tonsils out next week.       Home Medications Prior to Admission medications   Medication Sig Start Date End Date Taking? Authorizing Provider  benzonatate (TESSALON) 100 MG capsule Take 1 capsule (100 mg total) by mouth every 8 (eight) hours. 08/24/22  Yes Renne Crigler, PA-C  nirmatrelvir & ritonavir (PAXLOVID, 300/100,) 20 x 150 MG & 10 x 100MG  TBPK Take 3 tablets by mouth 2 (two) times daily for 5 days. 08/24/22 08/29/22 Yes Renne Crigler, PA-C  buPROPion (WELLBUTRIN XL) 150 MG 24 hr tablet Take 150 mg by mouth daily. 12/23/20   [provider]  hydrOXYzine (ATARAX) 25 MG tablet Take 25 mg by mouth at bedtime. 12/23/20   [provider]  meclizine (ANTIVERT) 25 MG tablet Take 1 tablet (25 mg total) by mouth 3 (three) times daily as needed for dizziness. 04/02/21   Dione Booze, MD  ondansetron (ZOFRAN) 4 MG tablet Take 1 tablet (4 mg total) by mouth every 6 (six) hours as needed for nausea. 04/02/21   Dione Booze, MD  sertraline (ZOLOFT) 100 MG tablet Take 100 tablets by mouth daily. 12/12/19   [provider]  VITAMIN D PO Take 50,000 Units by mouth once a week. Sunday    [provider]      Allergies    Iodine, Shellfish allergy, and Septra [sulfamethoxazole-trimethoprim]    Review of Systems   Review of Systems  Physical Exam Updated Vital Signs BP (!) 116/99 (BP Location: Right Arm)    Pulse (!) 103   Temp 99.6 F (37.6 C) (Oral)   Resp 16   Ht 5\' 2"  (1.575 m)   Wt 80.7 kg   SpO2 99%   BMI 32.56 kg/m  Physical Exam Vitals and nursing note reviewed.  Constitutional:      Appearance: She is well-developed.  HENT:     Head: Normocephalic and atraumatic.     Jaw: No trismus.     Right Ear: Tympanic membrane, ear canal and external ear normal.     Left Ear: Tympanic membrane, ear canal and external ear normal.     Nose: Congestion present. No mucosal edema or rhinorrhea.     Mouth/Throat:     Mouth: Mucous membranes are moist. Mucous membranes are not dry. No oral lesions.     Pharynx: Uvula midline. No oropharyngeal exudate, posterior oropharyngeal erythema or uvula swelling.     Tonsils: No tonsillar abscesses.  Eyes:     General:        Right eye: No discharge.        Left eye: No discharge.     Conjunctiva/sclera: Conjunctivae normal.  Cardiovascular:     Rate and Rhythm: Normal rate and regular rhythm.     Heart sounds: Normal heart sounds.  Pulmonary:     Effort: Pulmonary effort is normal. No respiratory distress.  Breath sounds: Normal breath sounds. No wheezing or rales.     Comments: Lungs clear to auscultation bilaterally Abdominal:     Palpations: Abdomen is soft.     Tenderness: There is no abdominal tenderness. There is no guarding or rebound.  Musculoskeletal:     Cervical back: Normal range of motion and neck supple.  Lymphadenopathy:     Cervical: No cervical adenopathy.  Skin:    General: Skin is warm and dry.  Neurological:     Mental Status: She is alert.  Psychiatric:        Mood and Affect: Mood normal.     ED Results / Procedures / Treatments   Labs (all labs ordered are listed, but only abnormal results are displayed) Labs Reviewed  RESP PANEL BY RT-PCR (RSV, FLU A&B, COVID)  RVPGX2 - Abnormal; Notable for the following components:      Result Value   SARS Coronavirus 2 by RT PCR POSITIVE (*)    All other components  within normal limits    EKG None  Radiology No results found.  Procedures Procedures    Medications Ordered in ED Medications - No data to display  ED Course/ Medical Decision Making/ A&P   Patient seen and examined. History obtained directly from patient. Work-up including labs, imaging, EKG ordered in triage, if performed, were reviewed.    Labs/EKG: Independently reviewed and interpreted.  This included: COVID-positive, flu, RSV negative.  Imaging: None ordered  Medications/Fluids: None ordered  Most recent vital signs reviewed and are as follows: BP (!) 116/99 (BP Location: Right Arm)   Pulse (!) 103   Temp 99.6 F (37.6 C) (Oral)   Resp 16   Ht 5\' 2"  (1.575 m)   Wt 80.7 kg   SpO2 99%   BMI 32.56 kg/m   Initial impression: COVID-19  Detailed discussion had with with patient regarding COVID-19 precautions and written instructions given as well.  We discussed need to isolate themselves for 5 days from onset of symptoms and have 24 hours of improvement prior to breaking isolation.  We discussed that when breaking isolation, mask wearing for 5 additional days is required.  We discussed signs symptoms to return which include worsening shortness of breath, trouble breathing, or increased work of breathing.  Also return with persistent vomiting, confusion, passing out, or if they have any other concerns. Counseled on the need for rest and good hydration. Discussed that high-risk contacts should be aware of positive result and they need to quarantine and be tested if they develop any symptoms. Patient verbalizes understanding.                              Medical Decision Making Risk Prescription drug management.   Patient with respiratory illness symptoms, positive for COVID-19.  Exam seems consistent with this.  She looks well, nontoxic.  No respiratory distress.  No hypoxia.  She would like treatment with Paxlovid.  Will initiate tonight.  She is comfortable discharge  home.  No GI symptoms.  Do not suspect pneumonia.        Final Clinical Impression(s) / ED Diagnoses Final diagnoses:  COVID-19    Rx / DC Orders ED Discharge Orders          Ordered    nirmatrelvir & ritonavir (PAXLOVID, 300/100,) 20 x 150 MG & 10 x 100MG  TBPK  2 times daily        08/24/22 2237  benzonatate (TESSALON) 100 MG capsule  Every 8 hours        08/24/22 2237              Renne Crigler, PA-C 08/24/22 2241    Arby Barrette, MD 08/26/22 1329

## 2022-08-24 NOTE — ED Notes (Signed)
Reviewed AVS/discharge instruction with patient. Time allotted for and all questions answered. Patient is agreeable for d/c and escorted to ed exit by staff.  

## 2022-12-15 DIAGNOSIS — E559 Vitamin D deficiency, unspecified: Secondary | ICD-10-CM | POA: Diagnosis not present

## 2022-12-15 DIAGNOSIS — Z6830 Body mass index (BMI) 30.0-30.9, adult: Secondary | ICD-10-CM | POA: Diagnosis not present

## 2022-12-15 DIAGNOSIS — N390 Urinary tract infection, site not specified: Secondary | ICD-10-CM | POA: Diagnosis not present

## 2022-12-15 DIAGNOSIS — G4700 Insomnia, unspecified: Secondary | ICD-10-CM | POA: Diagnosis not present

## 2022-12-15 DIAGNOSIS — E782 Mixed hyperlipidemia: Secondary | ICD-10-CM | POA: Diagnosis not present

## 2022-12-15 DIAGNOSIS — E6609 Other obesity due to excess calories: Secondary | ICD-10-CM | POA: Diagnosis not present

## 2022-12-15 DIAGNOSIS — F410 Panic disorder [episodic paroxysmal anxiety] without agoraphobia: Secondary | ICD-10-CM | POA: Diagnosis not present

## 2022-12-15 DIAGNOSIS — F331 Major depressive disorder, recurrent, moderate: Secondary | ICD-10-CM | POA: Diagnosis not present

## 2022-12-15 DIAGNOSIS — F411 Generalized anxiety disorder: Secondary | ICD-10-CM | POA: Diagnosis not present

## 2022-12-15 DIAGNOSIS — R8290 Unspecified abnormal findings in urine: Secondary | ICD-10-CM | POA: Diagnosis not present

## 2022-12-15 DIAGNOSIS — Z654 Victim of crime and terrorism: Secondary | ICD-10-CM | POA: Diagnosis not present

## 2022-12-15 DIAGNOSIS — D510 Vitamin B12 deficiency anemia due to intrinsic factor deficiency: Secondary | ICD-10-CM | POA: Diagnosis not present

## 2022-12-16 DIAGNOSIS — N390 Urinary tract infection, site not specified: Secondary | ICD-10-CM | POA: Diagnosis not present

## 2023-01-25 ENCOUNTER — Encounter: Payer: Medicaid Other | Admitting: Obstetrics and Gynecology

## 2023-05-04 IMAGING — CR DG CHEST 2V
2 series · 2 of 2 positions shown · non-contrast
Comparison: None.

CLINICAL DATA: chest tightness

EXAM:
CHEST - 2 VIEW

[w chest pa]
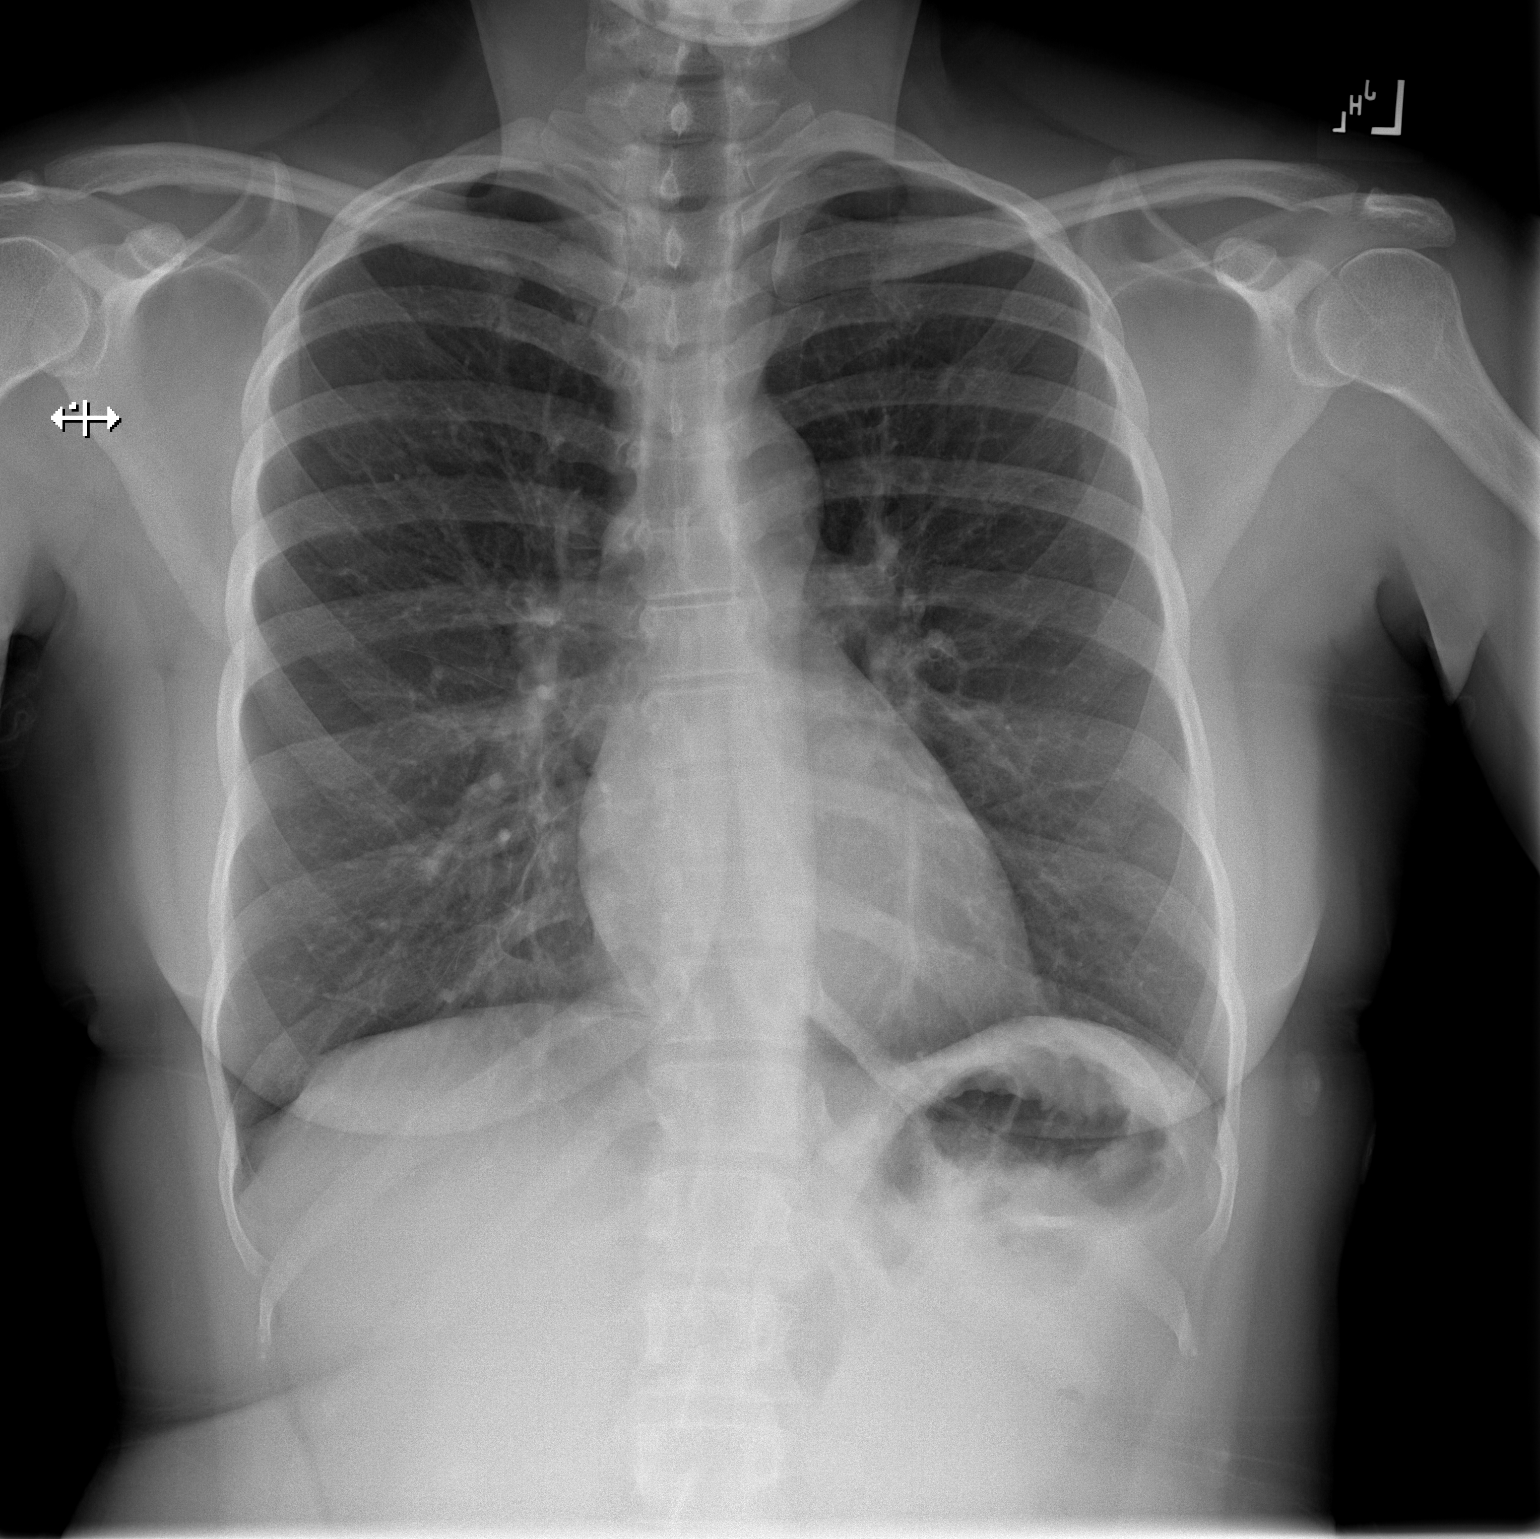

[w chest lat]
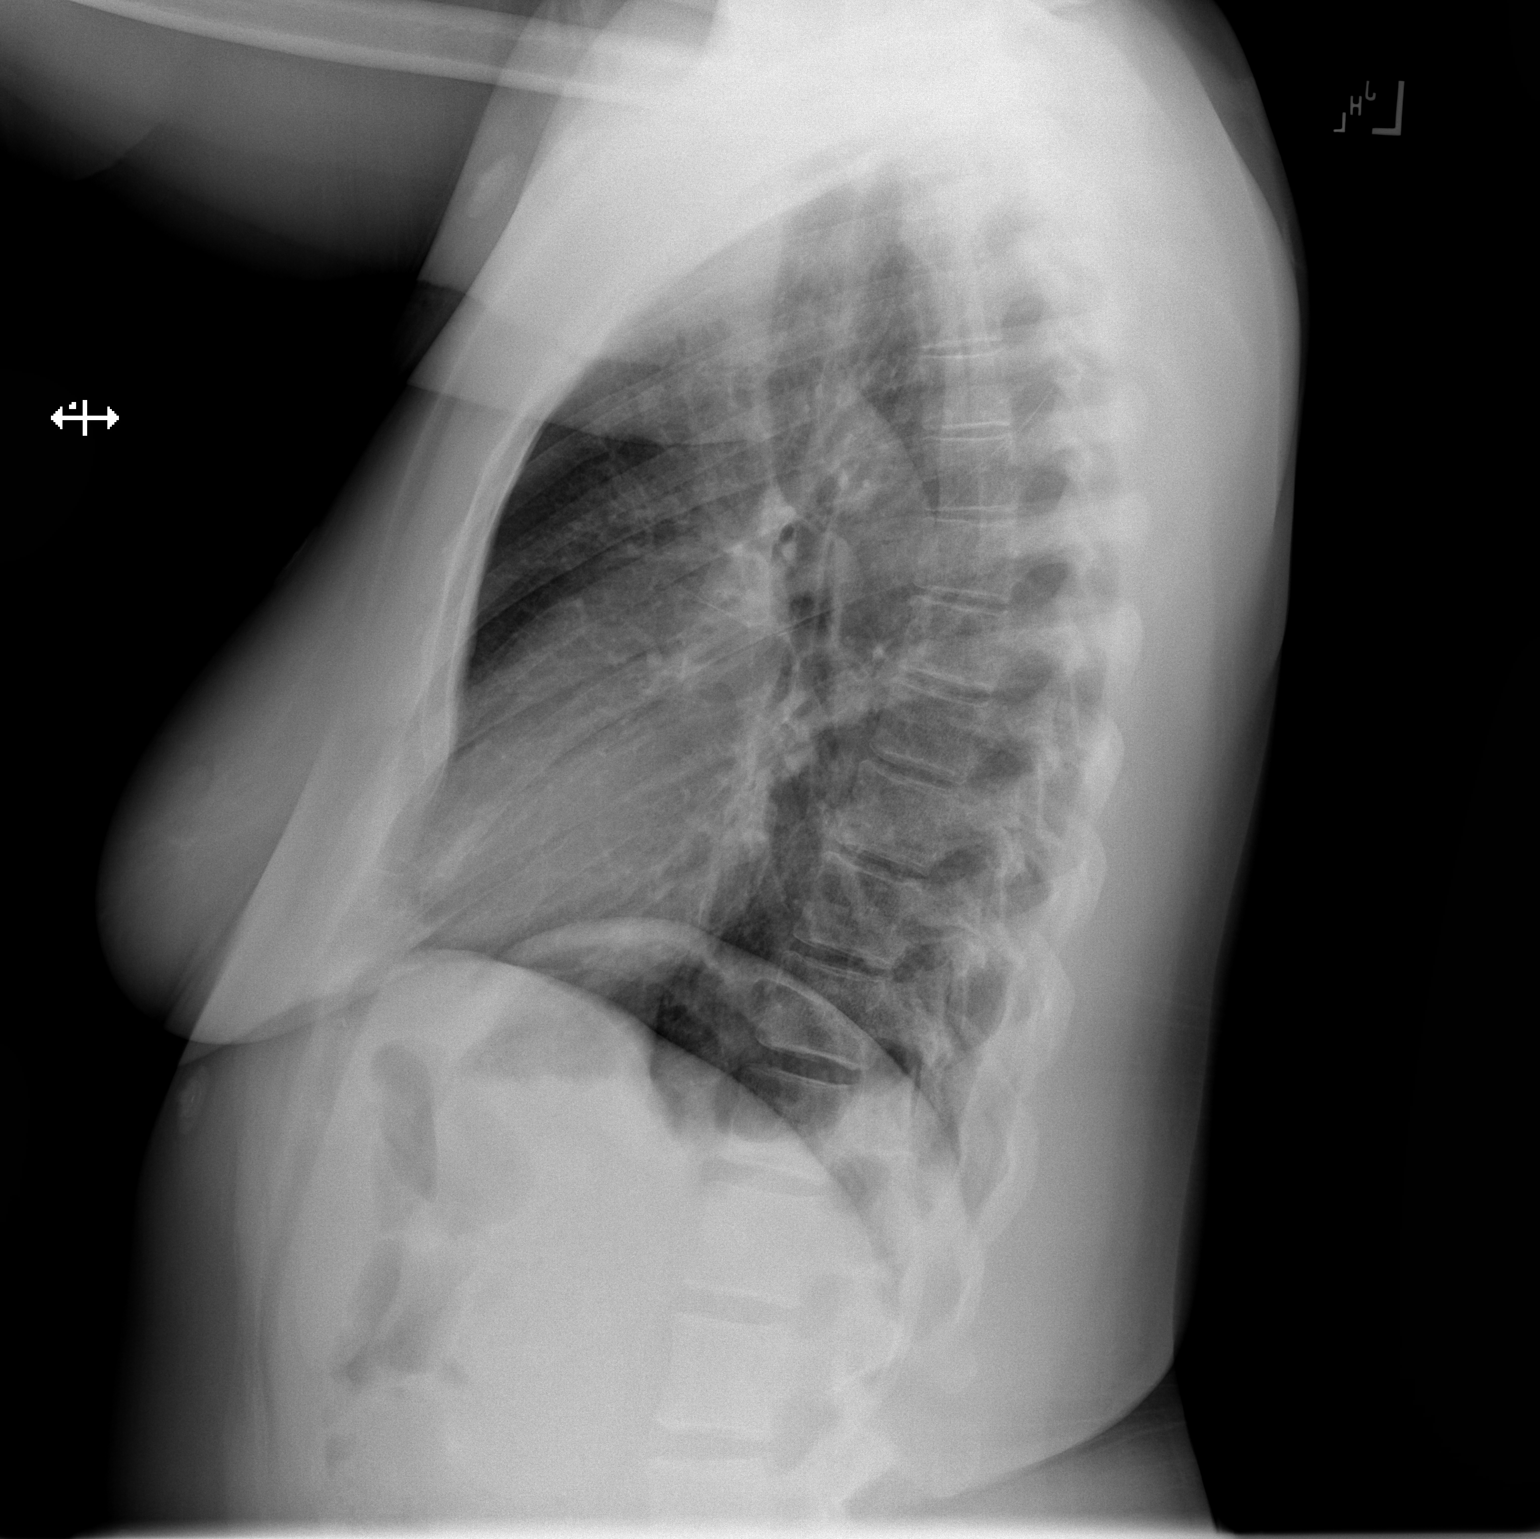

[2 of 2 positions shown; findings below may reference images not displayed]

FINDINGS: The heart and mediastinal contours are within normal limits.

No focal consolidation. No pulmonary edema. No pleural effusion. No
pneumothorax.

No acute osseous abnormality.
IMPRESSION: No active cardiopulmonary disease.

## 2023-05-10 DIAGNOSIS — F410 Panic disorder [episodic paroxysmal anxiety] without agoraphobia: Secondary | ICD-10-CM | POA: Diagnosis not present

## 2023-05-10 DIAGNOSIS — Z5900 Homelessness unspecified: Secondary | ICD-10-CM | POA: Diagnosis not present

## 2023-05-10 DIAGNOSIS — G4700 Insomnia, unspecified: Secondary | ICD-10-CM | POA: Diagnosis not present

## 2023-05-10 DIAGNOSIS — F331 Major depressive disorder, recurrent, moderate: Secondary | ICD-10-CM | POA: Diagnosis not present

## 2023-05-10 DIAGNOSIS — F411 Generalized anxiety disorder: Secondary | ICD-10-CM | POA: Diagnosis not present

## 2023-07-23 ENCOUNTER — Encounter (HOSPITAL_BASED_OUTPATIENT_CLINIC_OR_DEPARTMENT_OTHER): Payer: Self-pay

## 2023-07-23 ENCOUNTER — Other Ambulatory Visit (HOSPITAL_BASED_OUTPATIENT_CLINIC_OR_DEPARTMENT_OTHER): Payer: Self-pay

## 2023-07-23 ENCOUNTER — Emergency Department (HOSPITAL_BASED_OUTPATIENT_CLINIC_OR_DEPARTMENT_OTHER)
Admission: EM | Admit: 2023-07-23 | Discharge: 2023-07-23 | Disposition: A | Discharge status: Home / Self Care | Attending: Emergency Medicine | Admitting: Emergency Medicine

## 2023-07-23 ENCOUNTER — Other Ambulatory Visit: Payer: Self-pay

## 2023-07-23 DIAGNOSIS — R197 Diarrhea, unspecified: Secondary | ICD-10-CM | POA: Insufficient documentation

## 2023-07-23 DIAGNOSIS — R109 Unspecified abdominal pain: Secondary | ICD-10-CM | POA: Diagnosis not present

## 2023-07-23 DIAGNOSIS — I10 Essential (primary) hypertension: Secondary | ICD-10-CM | POA: Insufficient documentation

## 2023-07-23 DIAGNOSIS — R112 Nausea with vomiting, unspecified: Secondary | ICD-10-CM | POA: Insufficient documentation

## 2023-07-23 LAB — COMPREHENSIVE METABOLIC PANEL WITH GFR
ALT: 11 U/L (ref 0–44)
AST: 14 U/L — ABNORMAL LOW (ref 15–41)
Albumin: 3.7 g/dL (ref 3.5–5.0)
Alkaline Phosphatase: 65 U/L (ref 38–126)
Anion gap: 11 (ref 5–15)
BUN: 15 mg/dL (ref 6–20)
CO2: 22 mmol/L (ref 22–32)
Calcium: 9.2 mg/dL (ref 8.9–10.3)
Chloride: 106 mmol/L (ref 98–111)
Creatinine, Ser: 1.2 mg/dL — ABNORMAL HIGH (ref 0.44–1.00)
GFR, Estimated: 58 mL/min — ABNORMAL LOW (ref >60)
Glucose, Bld: 88 mg/dL (ref 70–99)
Potassium: 4.1 mmol/L (ref 3.5–5.1)
Sodium: 139 mmol/L (ref 135–145)
Total Bilirubin: 0.3 mg/dL (ref 0.0–1.2)
Total Protein: 6.7 g/dL (ref 6.5–8.1)

## 2023-07-23 LAB — URINALYSIS, ROUTINE W REFLEX MICROSCOPIC
Bilirubin Urine: NEGATIVE
Glucose, UA: NEGATIVE mg/dL
Ketones, ur: NEGATIVE mg/dL
Nitrite: NEGATIVE
Specific Gravity, Urine: 1.028 (ref 1.005–1.030)
pH: 6 (ref 5.0–8.0)

## 2023-07-23 LAB — CBC WITH DIFFERENTIAL/PLATELET
Abs Immature Granulocytes: 0.01 10*3/uL (ref 0.00–0.07)
Basophils Absolute: 0 10*3/uL (ref 0.0–0.1)
Basophils Relative: 0 %
Eosinophils Absolute: 0.1 10*3/uL (ref 0.0–0.5)
Eosinophils Relative: 2 %
HCT: 37.5 % (ref 36.0–46.0)
Hemoglobin: 12.1 g/dL (ref 12.0–15.0)
Immature Granulocytes: 0 %
Lymphocytes Relative: 34 %
Lymphs Abs: 1.5 10*3/uL (ref 0.7–4.0)
MCH: 31 pg (ref 26.0–34.0)
MCHC: 32.3 g/dL (ref 30.0–36.0)
MCV: 96.2 fL (ref 80.0–100.0)
Monocytes Absolute: 0.3 10*3/uL (ref 0.1–1.0)
Monocytes Relative: 7 %
Neutro Abs: 2.5 10*3/uL (ref 1.7–7.7)
Neutrophils Relative %: 57 %
Platelets: 257 10*3/uL (ref 150–400)
RBC: 3.9 MIL/uL (ref 3.87–5.11)
RDW: 13.8 % (ref 11.5–15.5)
WBC: 4.4 10*3/uL (ref 4.0–10.5)
nRBC: 0 % (ref 0.0–0.2)

## 2023-07-23 LAB — LIPASE, BLOOD: Lipase: 23 U/L (ref 11–51)

## 2023-07-23 MED ORDER — ONDANSETRON HCL 4 MG/2ML IJ SOLN
4.0000 mg | Freq: Once | INTRAMUSCULAR | Status: AC
  Administered 2023-07-23: 4 mg via INTRAVENOUS
  Filled 2023-07-23: qty 2

## 2023-07-23 MED ORDER — SODIUM CHLORIDE 0.9 % IV BOLUS
1000.0000 mL | Freq: Once | INTRAVENOUS | Status: AC
  Administered 2023-07-23: 1000 mL via INTRAVENOUS

## 2023-07-23 MED ORDER — ONDANSETRON 4 MG PO TBDP
4.0000 mg | ORAL_TABLET | Freq: Three times a day (TID) | ORAL | 0 refills | Status: AC | PRN
  Filled 2023-07-23: qty 20, 7d supply, fill #0

## 2023-07-23 NOTE — ED Triage Notes (Signed)
 Sharp abd pain with vomiting and diarrhea since yesterday.

## 2023-07-23 NOTE — Discharge Instructions (Addendum)
 Please return if you develop worsening pain, fever, uncontrollable nausea or other concerning symptoms.  Take Zofran  as needed for nausea.

## 2023-07-23 NOTE — ED Provider Notes (Signed)
 [place] EMERGENCY DEPARTMENT AT [facility] Provider Note   CSN: [id number] Arrival date & time: 07/23/23  0740     History  Chief Complaint  Patient presents with   Abdominal Pain   Diarrhea    Sheila Carr is a 43 y.o. female.  Patient here with some abdominal cramping nausea vomiting and cannot keep anything down the last couple days except for fluids and carbs.  History of hypertension anxiety depression.  She has been having some diarrhea.  Vomiting mostly resolved but diarrhea continues.  May be some sick contacts.  No obvious suspicious food intake.  Not having any focal abdominal pain more cramping pain that comes and goes.  Denies any chest pain shortness of breath.  Prior C-section.  No pain with urination.  The history is provided by the patient.       Home Medications Prior to Admission medications   Medication Sig Start Date End Date Taking? Authorizing Provider  ondansetron  (ZOFRAN -ODT) 4 MG disintegrating tablet Take 1 tablet (4 mg total) by mouth every 8 (eight) hours as needed for nausea or vomiting. 07/23/23  Yes Sheila Hyneman, DO  benzonatate  (TESSALON ) 100 MG capsule Take 1 capsule (100 mg total) by mouth every 8 (eight) hours. 08/24/22   Carr, Joshua, PA-C  buPROPion (WELLBUTRIN XL) 150 MG 24 hr tablet Take 150 mg by mouth daily. 12/23/20   [provider]  hydrOXYzine (ATARAX) 25 MG tablet Take 25 mg by mouth at bedtime. 12/23/20   [provider]  meclizine  (ANTIVERT ) 25 MG tablet Take 1 tablet (25 mg total) by mouth 3 (three) times daily as needed for dizziness. 04/02/21   Sheila April, MD  ondansetron  (ZOFRAN ) 4 MG tablet Take 1 tablet (4 mg total) by mouth every 6 (six) hours as needed for nausea. 04/02/21   Sheila April, MD  sertraline (ZOLOFT) 100 MG tablet Take 100 tablets by mouth daily. 12/12/19   [provider]  VITAMIN D PO Take 50,000 Units by mouth once a week. Sunday    [provider]       Allergies    Iodine, Shellfish allergy, and Septra [sulfamethoxazole-trimethoprim]    Review of Systems   Review of Systems  Physical Exam Updated Vital Signs BP 109/76   Pulse 73   Temp 98.4 F (36.9 C) (Oral)   Resp 18   Ht 5\' 2"  (1.575 m)   Wt 70.3 kg   SpO2 100%   BMI 28.35 kg/m  Physical Exam Vitals and nursing note reviewed.  Constitutional:      General: She is not in acute distress.    Appearance: She is well-developed. She is not ill-appearing.  HENT:     Head: Normocephalic and atraumatic.     Nose: Nose normal.     Mouth/Throat:     Mouth: Mucous membranes are moist.  Eyes:     Extraocular Movements: Extraocular movements intact.     Conjunctiva/sclera: Conjunctivae normal.     Pupils: Pupils are equal, round, and reactive to light.  Cardiovascular:     Rate and Rhythm: Normal rate and regular rhythm.     Pulses: Normal pulses.     Heart sounds: Normal heart sounds. No murmur heard. Pulmonary:     Effort: Pulmonary effort is normal. No respiratory distress.     Breath sounds: Normal breath sounds.  Abdominal:     General: Abdomen is flat.     Palpations: Abdomen is soft.     Tenderness: There  is no abdominal tenderness.  Musculoskeletal:        General: No swelling. Normal range of motion.     Cervical back: Normal range of motion and neck supple.  Skin:    General: Skin is warm and dry.     Capillary Refill: Capillary refill takes less than 2 seconds.  Neurological:     General: No focal deficit present.     Mental Status: She is alert.  Psychiatric:        Mood and Affect: Mood normal.     ED Results / Procedures / Treatments   Labs (all labs ordered are listed, but only abnormal results are displayed) Labs Reviewed  COMPREHENSIVE METABOLIC PANEL WITH GFR - Abnormal; Notable for the following components:      Result Value   Creatinine, Ser 1.20 (*)    AST 14 (*)    GFR, Estimated 58 (*)    All other components within normal limits   URINALYSIS, ROUTINE W REFLEX MICROSCOPIC - Abnormal; Notable for the following components:   Hgb urine dipstick TRACE (*)    Protein, ur TRACE (*)    Leukocytes,Ua SMALL (*)    Bacteria, UA RARE (*)    All other components within normal limits  CBC WITH DIFFERENTIAL/PLATELET  LIPASE, BLOOD    EKG None  Radiology No results found.  Procedures Procedures    Medications Ordered in ED Medications  sodium chloride 0.9 % bolus 1,000 mL (1,000 mLs Intravenous New Bag/Given 07/23/23 0826)  ondansetron  (ZOFRAN ) injection 4 mg (4 mg Intravenous Given 07/23/23 1610)    ED Course/ Medical Decision Making/ A&P                                 Medical Decision Making Amount and/or Complexity of Data Reviewed Labs: ordered.  Risk Prescription drug management.   Sheila Carr is here with nausea vomiting diarrhea abdominal cramping.  Normal vitals.  No fever.  No significant medical history.  Hypertension anxiety history.  Differential diagnosis likely viral process/gastroenteritis/food poisoning versus really no concern for appendicitis or pancreatitis or cholecystitis or bowel obstruction.  Will check basic labs including CBC CMP lipase urinalysis and give IV fluids IV Zofran  and reevaluate.  Will look for electrolyte abnormalities dehydration.  At this time I have low suspicion for major intra-abdominal process that would require any imaging.  Overall lab work with no significant anemia or electrolyte abnormality.  Creatinine was 1.2.  She got IV fluids.  Feeling better.  Overall I suspect viral process.  On repeat exam I do not think she needs any need for any abdominal imaging at this time.  Urinalysis negative for infection.  Recommend hydration simple diet at home.  Will prescribe Zofran .  Discharged in good condition.  Understands return precautions.  This chart was dictated using voice recognition software.  Despite best efforts to proofread,  errors can occur which can change the  documentation meaning.         Final Clinical Impression(s) / ED Diagnoses Final diagnoses:  Diarrhea, unspecified type  Nausea and vomiting, unspecified vomiting type    Rx / DC Orders ED Discharge Orders          Ordered    ondansetron  (ZOFRAN -ODT) 4 MG disintegrating tablet  Every 8 hours PRN        07/23/23 0931              Thania Woodlief,  Ysidro Ramsay, DO 07/23/23 1610

## 2023-08-02 ENCOUNTER — Other Ambulatory Visit (HOSPITAL_BASED_OUTPATIENT_CLINIC_OR_DEPARTMENT_OTHER): Payer: Self-pay

## 2023-11-09 DIAGNOSIS — F329 Major depressive disorder, single episode, unspecified: Secondary | ICD-10-CM | POA: Diagnosis not present

## 2023-11-09 DIAGNOSIS — F419 Anxiety disorder, unspecified: Secondary | ICD-10-CM | POA: Diagnosis not present

## 2023-12-02 DIAGNOSIS — F419 Anxiety disorder, unspecified: Secondary | ICD-10-CM | POA: Diagnosis not present

## 2023-12-02 DIAGNOSIS — F329 Major depressive disorder, single episode, unspecified: Secondary | ICD-10-CM | POA: Diagnosis not present

## 2023-12-24 ENCOUNTER — Telehealth: Admitting: Family Medicine

## 2023-12-24 DIAGNOSIS — G44319 Acute post-traumatic headache, not intractable: Secondary | ICD-10-CM | POA: Diagnosis not present

## 2023-12-24 NOTE — Progress Notes (Signed)
 Virtual Visit Consent   Sheila Carr, you are scheduled for a virtual visit with a [facility] provider today. Just as with appointments in the office, your consent must be obtained to participate. Your consent will be active for this visit and any virtual visit you may have with one of our providers in the next 365 days. If you have a MyChart account, a copy of this consent can be sent to you electronically.  As this is a virtual visit, video technology does not allow for your provider to perform a traditional examination. This may limit your provider's ability to fully assess your condition. If your provider identifies any concerns that need to be evaluated in person or the need to arrange testing (such as labs, EKG, etc.), we will make arrangements to do so. Although advances in technology are sophisticated, we cannot ensure that it will always work on either your end or our end. If the connection with a video visit is poor, the visit may have to be switched to a telephone visit. With either a video or telephone visit, we are not always able to ensure that we have a secure connection.  By engaging in this virtual visit, you consent to the provision of healthcare and authorize for your insurance to be billed (if applicable) for the services provided during this visit. Depending on your insurance coverage, you may receive a charge related to this service.  I need to obtain your verbal consent now. Are you willing to proceed with your visit today? Jamani L Hinks has provided verbal consent on 12/24/2023 for a virtual visit (video or telephone). Roosvelt Mater, [place]  Date: 12/24/2023 12:53 PM   Virtual Visit via Video Note   IRoosvelt Mater, connected with  AASIA PEAVLER  ([id number], [birth year]) on 12/24/23 at 12:45 PM EST by a video-enabled telemedicine application and verified that I am speaking with the correct person using two identifiers.  Location: Patient: Virtual Visit Location Patient:  Home Provider: Virtual Visit Location Provider: Home Office   I discussed the limitations of evaluation and management by telemedicine and the availability of in person appointments. The patient expressed understanding and agreed to proceed.    History of Present Illness: Sheila Carr is a 43 y.o. who identifies as a female who was assigned female at birth, and is being seen today for c/o about 2 days ago she was transporting something from one place to the next and fell down a hill and hit her head really hard.  Pt states she had a head pain and has taken Tylenol  pain.  Pt states she has pain in her tailbone as well.  Pt states she doesn't recall hitting her head and has a lot of pain and pressure. Pt states she states she feels like if she sneezes her head is going to explode.  Pt states when she takes the Tylenol  her pain gets a little better. Pt states she doesn't have nausea, vomiting or vision changes. Pt denies memory loss. Pt denies slurred speech or delay.    HPI: HPI  Problems: There are no active problems to display for this patient.   Allergies:  Allergies  Allergen Reactions   Iodine Anaphylaxis   Shellfish Allergy Anaphylaxis    ALL FISH AS WELL   Septra [Sulfamethoxazole-Trimethoprim] Hives   Medications:  Current Outpatient Medications:    benzonatate  (TESSALON ) 100 MG capsule, Take 1 capsule (100 mg total) by mouth every 8 (eight) hours., Disp: 15 capsule, Rfl:  0   buPROPion (WELLBUTRIN XL) 150 MG 24 hr tablet, Take 150 mg by mouth daily., Disp: , Rfl:    hydrOXYzine (ATARAX) 25 MG tablet, Take 25 mg by mouth at bedtime., Disp: , Rfl:    meclizine  (ANTIVERT ) 25 MG tablet, Take 1 tablet (25 mg total) by mouth 3 (three) times daily as needed for dizziness., Disp: 30 tablet, Rfl: 0   ondansetron  (ZOFRAN ) 4 MG tablet, Take 1 tablet (4 mg total) by mouth every 6 (six) hours as needed for nausea., Disp: 20 tablet, Rfl: 0   ondansetron  (ZOFRAN -ODT) 4 MG disintegrating tablet,  Take 1 tablet (4 mg total) by mouth every 8 (eight) hours as needed for nausea or vomiting., Disp: 20 tablet, Rfl: 0   sertraline (ZOLOFT) 100 MG tablet, Take 100 tablets by mouth daily., Disp: , Rfl:    VITAMIN D PO, Take 50,000 Units by mouth once a week. Sunday, Disp: , Rfl:   Observations/Objective: Patient is well-developed, well-nourished in no acute distress.  Resting comfortably at home.  Head is normocephalic, atraumatic.  No labored breathing.  Speech is clear and coherent with logical content.  Patient is alert and oriented at baseline.    Assessment and Plan: 1. Acute post-traumatic headache, not intractable (Primary)  -Pt is unsure if she suffered a concussion or not and is not wanting to go to the emergency room or urgent care because she does not exhibit concussion symptoms -Pt was advised if worsening or persistent symptoms to follow up with in person urgent care, emergency room or PCP for further evaluation   Follow Up Instructions: I discussed the assessment and treatment plan with the patient. The patient was provided an opportunity to ask questions and all were answered. The patient agreed with the plan and demonstrated an understanding of the instructions.  A copy of instructions were sent to the patient via MyChart unless otherwise noted below.    The patient was advised to call back or seek an in-person evaluation if the symptoms worsen or if the condition fails to improve as anticipated.    Roosvelt Mater, PA-C

## 2023-12-24 NOTE — Patient Instructions (Signed)
 Sheila Carr, thank you for joining Roosvelt Mater, PA-C for today's virtual visit.  While this provider is not your primary care provider (PCP), if your PCP is located in our provider database this encounter information will be shared with them immediately following your visit.   A [facility] MyChart account gives you access to today's visit and all your visits, tests, and labs performed at [facility]  click here if you don't have a [facility] MyChart account or go to [email or web address]  Consent: (Patient) Sheila Carr provided verbal consent for this virtual visit at the beginning of the encounter.  Current Medications:  Current Outpatient Medications:    benzonatate  (TESSALON ) 100 MG capsule, Take 1 capsule (100 mg total) by mouth every 8 (eight) hours., Disp: 15 capsule, Rfl: 0   buPROPion (WELLBUTRIN XL) 150 MG 24 hr tablet, Take 150 mg by mouth daily., Disp: , Rfl:    hydrOXYzine (ATARAX) 25 MG tablet, Take 25 mg by mouth at bedtime., Disp: , Rfl:    meclizine  (ANTIVERT ) 25 MG tablet, Take 1 tablet (25 mg total) by mouth 3 (three) times daily as needed for dizziness., Disp: 30 tablet, Rfl: 0   ondansetron  (ZOFRAN ) 4 MG tablet, Take 1 tablet (4 mg total) by mouth every 6 (six) hours as needed for nausea., Disp: 20 tablet, Rfl: 0   ondansetron  (ZOFRAN -ODT) 4 MG disintegrating tablet, Take 1 tablet (4 mg total) by mouth every 8 (eight) hours as needed for nausea or vomiting., Disp: 20 tablet, Rfl: 0   sertraline (ZOLOFT) 100 MG tablet, Take 100 tablets by mouth daily., Disp: , Rfl:    VITAMIN D PO, Take 50,000 Units by mouth once a week. Sunday, Disp: , Rfl:    Medications ordered in this encounter:  No orders of the defined types were placed in this encounter.    *If you need refills on other medications prior to your next appointment, please contact your pharmacy*  Follow-Up: Call back or seek an in-person evaluation if the symptoms worsen or if the condition  fails to improve as anticipated.  [facility] [phone]  Other Instructions Concussion, Adult  A concussion is a brain injury from a hard, direct hit (trauma) to your head or body. This hit causes your brain to quickly shake back and forth inside your skull. A concussion may also be called a mild traumatic brain injury (TBI). Healing from this injury can take time. The effects of a concussion can be serious. If you have a concussion, you should be very careful to avoid having a second concussion. What are the causes? This condition is caused by: A direct hit to your head. A quick and sudden movement of the head or neck, such as in a car crash. What are the signs or symptoms? The signs of a concussion can be hard to notice. They may be missed by you, family members, and doctors. You may look fine on the outside but may not act or feel normal. Physical symptoms Headaches or feeling dizzy. Problems with body balance. Being sensitive to light or noise. Vomiting or feeling like you may vomit. Being tired. Problems seeing or hearing. Seizure. Mental and emotional symptoms Feeling grouchy (irritable) or having mood changes. Problems remembering things. Trouble focusing your mind (concentrating), organizing, or making decisions. Not sleeping or eating as you used to. Being slow to think, act, react, speak, or read. Feeling worried or nervous (anxious). Feeling sad (depressed). How is this treated? This condition  may be treated by: Stopping sports or activity if you are injured. Resting your body and your mind. Being watched carefully, often at home. Medicines to help with symptoms such as: Headaches. Feeling like you may vomit. Problems with sleep. You may need to go to a concussion clinic or a place to help you recover (rehab). Follow these instructions at home: Activity Limit activities that need a lot of thought or focus, such as: Homework or work for your  job. Watching TV. Using the computer or phone. Playing memory games and puzzles. Get rest because this helps your brain heal. Make sure you: Get plenty of sleep. Most adults should get 7-9 hours of sleep each night. Rest during the day. Take naps or breaks when you feel tired. Avoid activity or exercise that takes a lot of effort until your doctor says it is safe. Stop any activity that makes symptoms worse. Your doctor may tell you to do light exercise like walking. Do not do activities that could cause a second concussion, such as riding a bike or playing sports. Ask your doctor when you can return to your normal activities, such as school, work, sports, and driving. Your ability to react may be slower. Do not do these activities if you are dizzy. General instructions  Take over-the-counter and prescription medicines only as told by your doctor. Avoid taking strong pain medicines (opioids) after a concussion. Do not drink alcohol until your doctor says you can. Watch your symptoms and tell other people to do the same. Other problems can occur after a concussion. Tell your work production designer, theatre/television/film, teachers, tax adviser, school counselor, coach, or event organiser about your injury and symptoms. Tell them about what you can or cannot do. See a mental health therapist if you keep feeling worried and nervous or sad. Keep all follow-up visits. Your doctor will check on your recovery and give you a plan for returning to activities. How is this prevented? It is very important that you do not get another brain injury. In rare cases, another injury can cause brain damage that will not go away, brain swelling, or death. The risk of this is greatest in the first 7-10 days after a head injury. To avoid injuries: Stop activities that could lead to a second concussion, such as contact sports, until your doctor says it is okay. When you return to sports or activities: Do not crash into other players. This is  how most concussions happen. Follow the rules. Respect other players. Do not engage in violent behavior while playing. Get regular exercise. Do strength and balance training. Wear a helmet that fits you well during sports, biking, or other activities. Helmets can help protect you from serious skull and brain injuries, but they may not protect you from a concussion. Even when wearing a helmet, you should avoid being hit in the head. Where to find more information Centers for Disease Control and Prevention: tonerpromos.no Contact a doctor if: Your symptoms do not get better or get worse. You have new symptoms. You have another injury. Your balance gets worse. You have changes in how you act. Get help right away if: You have very bad headaches or your headaches get worse. You have any of these problems: Feeling weak or numb in any part of your body. Slurred speech. Changes in how you see (vision). Feeling mixed up (confused). You vomit often. You faint or other people have trouble waking you up. You have a seizure. These symptoms may be an emergency.  Get help right away. Call 911. Do not wait to see if the symptoms will go away. Do not drive yourself to the hospital. Also, get help right away if: You have thoughts of hurting yourself or others. Take one of these steps if you feel like you may hurt yourself or others, or have thoughts about taking your own life: Go to your nearest emergency room. Call 911. Call the National Suicide Prevention Lifeline at 563-605-8127 or 988. This is open 24 hours a day. Text the Crisis Text Line at (475) 649-5631. This information is not intended to replace advice given to you by your health care provider. Make sure you discuss any questions you have with your health care provider. Document Revised: 06/26/2021 Document Reviewed: 06/26/2021 Elsevier Patient Education  2024 [employer]   If you have been instructed to have an in-person evaluation today at a  local Urgent Care facility, please use the link below. It will take you to a list of all of our available [facility], including address, phone number and hours of operation. Please do not delay care.  [facility]  If you or a family member do not have a primary care provider, use the link below to schedule a visit and establish care. When you choose a [facility] primary care physician or advanced practice provider, you gain a long-term partner in health. Find a Primary Care Provider  Learn more about [facility]'s in-office and virtual care options: [facility] - Get Care Now
